# Patient Record
Sex: Female | Born: 1990 | Race: White | Hispanic: No | Marital: Married | Smoking: Former smoker
Health system: Southern US, Community
[De-identification: ages and names within clinical notes are randomized; demographics above are authoritative.]

## PROBLEM LIST (undated history)

## (undated) ENCOUNTER — Inpatient Hospital Stay (HOSPITAL_COMMUNITY): Payer: Self-pay

## (undated) ENCOUNTER — Inpatient Hospital Stay: Payer: Self-pay

## (undated) DIAGNOSIS — R519 Headache, unspecified: Secondary | ICD-10-CM

## (undated) DIAGNOSIS — R51 Headache: Secondary | ICD-10-CM

## (undated) DIAGNOSIS — O34219 Maternal care for unspecified type scar from previous cesarean delivery: Secondary | ICD-10-CM

## (undated) HISTORY — PX: LASIK: SHX215

---

## 2014-05-12 LAB — OB RESULTS CONSOLE ABO/RH: RH Type: POSITIVE

## 2014-05-12 LAB — OB RESULTS CONSOLE ANTIBODY SCREEN: Antibody Screen: NEGATIVE

## 2014-05-12 LAB — OB RESULTS CONSOLE RPR: RPR: NONREACTIVE

## 2014-05-12 LAB — OB RESULTS CONSOLE HIV ANTIBODY (ROUTINE TESTING): HIV: NONREACTIVE

## 2014-05-14 LAB — OB RESULTS CONSOLE GC/CHLAMYDIA
Chlamydia: NEGATIVE
GC PROBE AMP, GENITAL: NEGATIVE

## 2014-05-25 NOTE — L&D Delivery Note (Signed)
Cesarean Section Procedure Note  Indications: fetal intolerance of labor  Pre-operative Diagnosis: post term pregnancy with fetal intolerance of labor  Post-operative Diagnosis: same, live female infant  Attending: Mycah Formica, Honor Loh  Anesthesia: Epirdural, bolused  Surgeon: Kaybree Williams, Honor Loh  Assistant: Ova Freshwater  Estimated Blood Loss: 500cc   Drains: foley, to gravity, on-q pump with 2 cathethers   Total IV Fluids: 1042ml crystalloid  Findings: Normal appearing uterus, tubes and ovaries, no scar tissue.   Viable female infant weighing 6-10 (3000mg ) with apgars of 8 and 9.   Specimens: none   Complications: None apparent    Disposition: PACU - hemodynamically stable.     Indication for Surgery:  Cathy Benjamin was scheduled for an induction of labor due to past due dates, at 37 weeks. She arrived to the labor floor and was given cervadil due to a low Bishop score prompting cervical ripening. Through the night, she began contracting regularly and was found to be 3cm and 100% effaced, and the cervadil was removed. Following this, the fetal strip began to become concerning, with deep variables indicating cord compression. Intermittently there were late decelerations. There were no accelerations, and in between variables there were times of minimal variability. An IUPC was placed and an amnioinfusion was attempted, with the patient on hands and knees. This continued for one hour, and the decelerations decreased in frequency some, but remained persistent. The patient dilated to 6cm, however without resolve of the decelerations, the decision to proceed with a cesarean for fetal intolerance of labor was made. Terbutaline was administered and with uterine relaxation, the decelrations resolved.   The risks, benefits, complications, treatment options, and expected outcomes were discussed with the patient. Informed  consent was obtained. The patient was taken to Operating Room, identified as Cathy Benjamin and the procedure verified as a cesarean delivery.   Procedure Details   After administration of adequate anesthesia, the patient was draped and prepped in the usual sterile manner. A surgical time out was performed, with the pediatric team present. A Pfannenstiel incision was made and carried down through the subcutaneous tissue to the fascia. Fascial incision was made and extended transversely. The fascia was separated from the underlying rectus tissue superiorly and inferiorly. The peritoneum was identified and entered. Peritoneal incision was extended longitudinally. A low transverse uterine incision was made. Delivered from cephalic presentation was a 3000 gram Female with Apgar scores of 8 at one minute and 9 at five minutes. The umbilical cord was doubly clamped and cut, and the baby was handed off to the awaitng pediatrician. Cord blood was obtained for evaluation. The placenta was removed intact and appeared normal. The uterus, tubes and ovaries appeared normal. The uterine incision was closed with running locked sutures of Vicryl, and then a second, imbricating stitch was placed. Hemostasis was observed. The abdominal cavity was evacuated of extraneous fluid. The uterus was returned to the abdominal cavity and again the incision was inspected for hemostasis, which was confirmed. The paracolic gutters were cleaned. The peritoneum was reapproximated with vicryl, and the On-Q catheters were pierced through the skin and placed along the rectus muscles, sub-facially. The fascia was then reapproximated with running sutures of Vicryl. The subcutaneous tissue was irrigated and reapproximated with vicryl, and the skin was reapproximated with Monocryl.  Instrument, sponge, and needle counts were correct prior the abdominal closure and at the conclusion of the case.   I was present and performed this procedure in  its entirety. Larey Days, MD Attending  Chartered loss adjuster OB/GYN [facility]

## 2014-09-18 ENCOUNTER — Emergency Department
Admit: 2014-09-18 | Disposition: A | Discharge status: Home / Self Care | Payer: Self-pay | Admitting: Emergency Medicine

## 2014-09-21 LAB — BETA STREP CULTURE(ARMC)

## 2014-10-19 LAB — OB RESULTS CONSOLE GBS: GBS: POSITIVE

## 2014-11-04 ENCOUNTER — Observation Stay
Admission: EM | Admit: 2014-11-04 | Discharge: 2014-11-04 | Disposition: A | Discharge status: Home / Self Care | Payer: Medicaid Other | Attending: Obstetrics and Gynecology | Admitting: Obstetrics and Gynecology

## 2014-11-04 ENCOUNTER — Encounter: Payer: Self-pay | Admitting: *Deleted

## 2014-11-04 DIAGNOSIS — Z3A39 39 weeks gestation of pregnancy: Secondary | ICD-10-CM | POA: Insufficient documentation

## 2014-11-04 DIAGNOSIS — O368190 Decreased fetal movements, unspecified trimester, not applicable or unspecified: Secondary | ICD-10-CM | POA: Diagnosis present

## 2014-11-04 DIAGNOSIS — O368130 Decreased fetal movements, third trimester, not applicable or unspecified: Principal | ICD-10-CM | POA: Insufficient documentation

## 2014-11-04 HISTORY — DX: Headache: R51

## 2014-11-04 HISTORY — DX: Headache, unspecified: R51.9

## 2014-11-04 NOTE — H&P (Signed)
Obstetric H&P   Chief Complaint: Decreased fetal movement  Prenatal Care Provider: [facility]  History of Present Illness: 24 y.o. G1P0 68w2dby 11/09/2014, by Last Menstrual Period presenting to L&D for decreased fetal movement.  No LOF, no VB, no ctx.  [facility] uncomplicated  ABO, Rh: A/Positive/-- (12/19 0000)  Antibody: Negative (12/19 0000)  Rubella: MMR x 2 Varicella: Immune RPR: Nonreactive (12/19 0000)  HBsAg:    HIV: Non-reactive (12/19 0000)  RPR: Nonreactive (12/19 0000) 1-hr:  GBS: Positive (05/27 0000)   Review of Systems: 10 point review of systems negative unless otherwise noted in HPI  Past Medical History: Past Medical History  Diagnosis Date  . Headache     Past Surgical History: Past Surgical History  Procedure Laterality Date  . No past surgeries      Past Obstetric History:  Past Gynecologic History:  Family History: History reviewed. No pertinent family history.  Social History: History   Social History  . Marital Status: Unknown    Spouse Name: N/A  . Number of Children: N/A  . Years of Education: N/A   Occupational History  . Not on file.   Social History Main Topics  . Smoking status: Former SResearch scientist (life sciences) . Smokeless tobacco: Never Used  . Alcohol Use: No  . Drug Use: No  . Sexual Activity: Yes   Other Topics Concern  . Not on file   Social History Narrative  . No narrative on file    Medications: Prior to Admission medications   Medication Sig Start Date End Date Taking? Authorizing Provider  diphenhydrAMINE (SOMINEX) 25 MG tablet Take 25 mg by mouth at bedtime as needed for sleep.   Yes Historical Provider, MD  Prenatal Vit-Fe Fumarate-FA (PRENATAL MULTIVITAMIN) TABS tablet Take 1 tablet by mouth daily at 12 noon.   Yes Historical Provider, MD    Allergies: No Known Allergies  Physical Exam: Vitals: Blood pressure 133/81, pulse 87, last menstrual period 02/02/2014.  Urine Dip Protein: N/A  FHT: 120, moderate  variability, +accels, no decels Toco: irregular  General: NAD HEENT: normocephalic anicteric Pulmonary: no increased work of breathing Abdomen: Gravid Genitourinary: deferred Extremities: no edema  Labs: No results found for this or any previous visit (from the past 24 hour(s)).  Assessment: 24y.o. G1P0 344w2dy 11/09/2014 with decreased fetal movement  Plan: 1) Fetus - reactive NST, fetal kick counts discussed,  Movement increased after arrival.  Routine labor precautions  2) Disposition - follow up [facility] on 6/15

## 2014-11-16 ENCOUNTER — Inpatient Hospital Stay
Admission: EM | Admit: 2014-11-16 | Discharge: 2014-11-20 | DRG: 766 | Disposition: A | Discharge status: Home / Self Care | Payer: Medicaid Other | Attending: Obstetrics & Gynecology | Admitting: Obstetrics & Gynecology

## 2014-11-16 DIAGNOSIS — Z22330 Carrier of Group B streptococcus: Secondary | ICD-10-CM

## 2014-11-16 DIAGNOSIS — O480 Post-term pregnancy: Secondary | ICD-10-CM | POA: Diagnosis present

## 2014-11-16 DIAGNOSIS — Z79899 Other long term (current) drug therapy: Secondary | ICD-10-CM | POA: Diagnosis not present

## 2014-11-16 DIAGNOSIS — Z3A41 41 weeks gestation of pregnancy: Secondary | ICD-10-CM | POA: Diagnosis present

## 2014-11-16 DIAGNOSIS — Z87891 Personal history of nicotine dependence: Secondary | ICD-10-CM | POA: Diagnosis not present

## 2014-11-16 DIAGNOSIS — Z98891 History of uterine scar from previous surgery: Secondary | ICD-10-CM

## 2014-11-16 DIAGNOSIS — Z3490 Encounter for supervision of normal pregnancy, unspecified, unspecified trimester: Secondary | ICD-10-CM

## 2014-11-16 LAB — CBC
HCT: 39.1 % (ref 35.0–47.0)
Hemoglobin: 13.1 g/dL (ref 12.0–16.0)
MCH: 30 pg (ref 26.0–34.0)
MCHC: 33.4 g/dL (ref 32.0–36.0)
MCV: 89.8 fL (ref 80.0–100.0)
Platelets: 190 10*3/uL (ref 150–440)
RBC: 4.36 MIL/uL (ref 3.80–5.20)
RDW: 14 % (ref 11.5–14.5)
WBC: 12.5 10*3/uL — ABNORMAL HIGH (ref 3.6–11.0)

## 2014-11-16 MED ORDER — LACTATED RINGERS IV SOLN: 500.0000 mL | INTRAVENOUS | Status: DC | PRN

## 2014-11-16 MED ORDER — LACTATED RINGERS IV SOLN
INTRAVENOUS | Status: DC
  Administered 2014-11-16 – 2014-11-17 (×2): via INTRAVENOUS

## 2014-11-16 MED ORDER — ZOLPIDEM TARTRATE 5 MG PO TABS
ORAL_TABLET | ORAL | Status: AC
  Administered 2014-11-17: 5 mg via ORAL
  Filled 2014-11-16: qty 1

## 2014-11-16 MED ORDER — ACETAMINOPHEN 325 MG PO TABS: 650.0000 mg | ORAL_TABLET | ORAL | Status: DC | PRN

## 2014-11-16 MED ORDER — SODIUM CHLORIDE 0.9 % IV SOLN
2.0000 g | INTRAVENOUS | Status: AC
  Administered 2014-11-17: 2 g via INTRAVENOUS

## 2014-11-16 MED ORDER — ZOLPIDEM TARTRATE 5 MG PO TABS
5.0000 mg | ORAL_TABLET | Freq: Every evening | ORAL | Status: DC | PRN
  Administered 2014-11-17: 5 mg via ORAL

## 2014-11-16 MED ORDER — DINOPROSTONE 10 MG VA INST
10.0000 mg | VAGINAL_INSERT | Freq: Once | VAGINAL | Status: AC
  Administered 2014-11-16: 10 mg via VAGINAL
  Filled 2014-11-16: qty 1

## 2014-11-16 MED ORDER — BUTORPHANOL TARTRATE 1 MG/ML IJ SOLN: 2.0000 mg | INTRAMUSCULAR | Status: DC | PRN

## 2014-11-16 MED ORDER — ONDANSETRON HCL 4 MG/2ML IJ SOLN
4.0000 mg | Freq: Four times a day (QID) | INTRAMUSCULAR | Status: DC | PRN
  Administered 2014-11-17: 4 mg via INTRAVENOUS

## 2014-11-16 MED ORDER — BUTORPHANOL TARTRATE 1 MG/ML IJ SOLN: 1.0000 mg | INTRAMUSCULAR | Status: DC | PRN

## 2014-11-16 MED ORDER — SODIUM CHLORIDE 0.9 % IV SOLN
1.0000 g | INTRAVENOUS | Status: DC
  Administered 2014-11-17: 1 g via INTRAVENOUS

## 2014-11-16 MED ORDER — LIDOCAINE HCL (PF) 1 % IJ SOLN: 30.0000 mL | INTRAMUSCULAR | Status: DC | PRN

## 2014-11-16 NOTE — H&P (Signed)
OB History & Physical   History of Present Illness:  Chief Complaint:   HPI:  Cathy Benjamin is a 24 y.o. G1P0 female at [redacted]w[redacted]d dated by LMP with EDC of 11/09/14.  Her pregnancy has been uncomplicated.  She presents to L&D for IOL for past due date.    +FM, occasional CTX, no LOF, no VB  Maternal Medical History:   Past Medical History  Diagnosis Date  . Headache     Past Surgical History  Procedure Laterality Date  . No past surgeries      No Known Allergies  Prior to Admission medications   Medication Sig Start Date End Date Taking? Authorizing Provider  diphenhydrAMINE (SOMINEX) 25 MG tablet Take 25 mg by mouth at bedtime as needed for sleep.    Historical Provider, MD  Prenatal Vit-Fe Fumarate-FA (PRENATAL MULTIVITAMIN) TABS tablet Take 1 tablet by mouth daily at 12 noon.    Historical Provider, MD     Prenatal care site: [facility]   Social History: She  reports that she has quit smoking. She has never used smokeless tobacco. She reports that she does not drink alcohol or use illicit drugs.  Family History: family history is not on file.   Review of Systems: Negative x 10 systems reviewed except as noted in the HPI.     Physical Exam:  Vital Signs: Temp(Src) 98.3 F (36.8 C) (Oral)  Resp 20  Ht 5\' 1"  (1.549 m)  Wt 88.451 kg (195 lb)  BMI 36.86 kg/m2  LMP 02/02/2014 General: no acute distress.  HEENT: normocephalic, atraumatic Heart: regular rate & rhythm.  No murmurs/rubs/gallops Lungs: clear to auscultation bilaterally, normal respiratory effort Abdomen: soft, gravid, non-tender; Pelvic:   External: Normal external female genitalia  Cervix: closed/long/high Extremities: non-tender, symmetric, 1+dema bilaterally.  DTRs: 2+ Neurologic: Alert & oriented x 3.    Pertinent Results:  Prenatal Labs: Blood type/Rh A+  Antibody screen neg  Rubella immune  RPR NR  HBsAg neg  HIV neg  GC neg  Chlamydia neg  Genetic screening declined  1 hour GTT 102   3 hour GTT n/a  GBS POS   FHT: 115 mod +accels no decels TOCO: irregular SVE: closed/long/high SSE: deferred    Assessment:  Cathy Benjamin is a 24 y.o. G1P0 female at [redacted]w[redacted]d with IOL for past due date  Plan:  1. Admit to Labor & Delivery - notify anesthesia.   2. CBC, T&S, Clrs, saline lock - start IVF after cervical ripening  3. GBS positive - will defer ABX until after cervical ripening 4. Consents obtained. 5. Cervidil for cervical ripening 6. Continuous EFM/TOCO  Ward, Chelsea C  11/16/2014 10:43 PM

## 2014-11-17 ENCOUNTER — Inpatient Hospital Stay: Payer: Medicaid Other | Admitting: Anesthesiology

## 2014-11-17 ENCOUNTER — Encounter
Admission: EM | Disposition: A | Discharge status: Home / Self Care | Payer: Self-pay | Source: Home / Self Care | Attending: Obstetrics & Gynecology

## 2014-11-17 ENCOUNTER — Encounter: Payer: Self-pay | Admitting: Anesthesiology

## 2014-11-17 LAB — CHLAMYDIA/NGC RT PCR (ARMC ONLY)
CHLAMYDIA TR: NOT DETECTED
N gonorrhoeae: NOT DETECTED

## 2014-11-17 LAB — TYPE AND SCREEN
ABO/RH(D): A POS
Antibody Screen: NEGATIVE

## 2014-11-17 LAB — ABO/RH: ABO/RH(D): A POS

## 2014-11-17 SURGERY — Surgical Case
Anesthesia: Epidural

## 2014-11-17 MED ORDER — OXYTOCIN 40 UNITS IN LACTATED RINGERS INFUSION - SIMPLE MED
62.5000 mL/h | INTRAVENOUS | Status: DC
  Administered 2014-11-18: 62.5 mL/h via INTRAVENOUS
  Filled 2014-11-17: qty 1000

## 2014-11-17 MED ORDER — LACTATED RINGERS IV SOLN: INTRAVENOUS | Status: DC

## 2014-11-17 MED ORDER — IBUPROFEN 600 MG PO TABS
600.0000 mg | ORAL_TABLET | Freq: Four times a day (QID) | ORAL | Status: DC
  Administered 2014-11-17 – 2014-11-20 (×11): 600 mg via ORAL
  Filled 2014-11-17 (×11): qty 1

## 2014-11-17 MED ORDER — AMPICILLIN SODIUM 2 G IJ SOLR
INTRAMUSCULAR | Status: AC
  Administered 2014-11-17: 2 g via INTRAVENOUS
  Filled 2014-11-17: qty 2000

## 2014-11-17 MED ORDER — BUPIVACAINE HCL 0.5 % IJ SOLN
INTRAMUSCULAR | Status: DC | PRN
  Administered 2014-11-17: 09:00:00
  Administered 2014-11-17: 30 mL

## 2014-11-17 MED ORDER — BUPIVACAINE ON-Q PAIN PUMP (FOR ORDER SET NO CHG)
INJECTION | Status: DC
  Filled 2014-11-17: qty 1

## 2014-11-17 MED ORDER — SODIUM CHLORIDE 0.9 % IV SOLN
INTRAVENOUS | Status: AC
  Administered 2014-11-17: 1 g via INTRAVENOUS
  Filled 2014-11-17: qty 1000

## 2014-11-17 MED ORDER — CEFAZOLIN SODIUM-DEXTROSE 2-3 GM-% IV SOLR
2.0000 g | INTRAVENOUS | Status: AC
  Administered 2014-11-17: 2 g via INTRAVENOUS

## 2014-11-17 MED ORDER — BUPIVACAINE 0.25 % ON-Q PUMP SINGLE CATH 400 ML: 400.0000 mL | INJECTION | Status: DC

## 2014-11-17 MED ORDER — ONDANSETRON HCL 4 MG/2ML IJ SOLN: 4.0000 mg | Freq: Once | INTRAMUSCULAR | Status: DC | PRN

## 2014-11-17 MED ORDER — HYDROMORPHONE HCL 1 MG/ML IJ SOLN: 0.2500 mg | INTRAMUSCULAR | Status: DC | PRN

## 2014-11-17 MED ORDER — BUPIVACAINE HCL (PF) 0.5 % IJ SOLN
INTRAMUSCULAR | Status: AC
  Filled 2014-11-17: qty 30

## 2014-11-17 MED ORDER — NALBUPHINE HCL 10 MG/ML IJ SOLN
5.0000 mg | INTRAMUSCULAR | Status: DC | PRN
  Filled 2014-11-17: qty 0.5

## 2014-11-17 MED ORDER — MEPERIDINE HCL 25 MG/ML IJ SOLN: 6.2500 mg | INTRAMUSCULAR | Status: DC | PRN

## 2014-11-17 MED ORDER — DIPHENHYDRAMINE HCL 50 MG/ML IJ SOLN: 12.5000 mg | INTRAMUSCULAR | Status: DC | PRN

## 2014-11-17 MED ORDER — LIDOCAINE-EPINEPHRINE (PF) 1.5 %-1:200000 IJ SOLN
INTRAMUSCULAR | Status: DC | PRN
  Administered 2014-11-17: 3 mL

## 2014-11-17 MED ORDER — OXYTOCIN 40 UNITS IN LACTATED RINGERS INFUSION - SIMPLE MED
62.5000 mL/h | INTRAVENOUS | Status: DC
  Administered 2014-11-17: 62.5 mL/h via INTRAVENOUS

## 2014-11-17 MED ORDER — LIDOCAINE HCL (PF) 2 % IJ SOLN
INTRAMUSCULAR | Status: DC | PRN
  Administered 2014-11-17: 10 mL via EPIDURAL
  Administered 2014-11-17: 6 mL via EPIDURAL
  Administered 2014-11-17: 1 mL via EPIDURAL

## 2014-11-17 MED ORDER — FERROUS SULFATE 325 (65 FE) MG PO TABS
325.0000 mg | ORAL_TABLET | Freq: Two times a day (BID) | ORAL | Status: DC
  Administered 2014-11-17 – 2014-11-19 (×4): 325 mg via ORAL
  Filled 2014-11-17 (×4): qty 1

## 2014-11-17 MED ORDER — OXYTOCIN BOLUS FROM INFUSION: 500.0000 mL | INTRAVENOUS | Status: DC

## 2014-11-17 MED ORDER — SENNOSIDES-DOCUSATE SODIUM 8.6-50 MG PO TABS
2.0000 | ORAL_TABLET | ORAL | Status: DC
  Administered 2014-11-18 – 2014-11-19 (×2): 2 via ORAL
  Filled 2014-11-17 (×2): qty 2

## 2014-11-17 MED ORDER — BUPIVACAINE 0.25 % ON-Q PUMP DUAL CATH 400 ML
INJECTION | Status: AC
  Filled 2014-11-17: qty 400

## 2014-11-17 MED ORDER — NALBUPHINE HCL 10 MG/ML IJ SOLN
5.0000 mg | Freq: Once | INTRAMUSCULAR | Status: AC | PRN
  Filled 2014-11-17: qty 0.5

## 2014-11-17 MED ORDER — TERBUTALINE SULFATE 1 MG/ML IJ SOLN
0.2500 mg | Freq: Once | INTRAMUSCULAR | Status: AC
  Administered 2014-11-17: 0.25 mg via SUBCUTANEOUS

## 2014-11-17 MED ORDER — EPHEDRINE 5 MG/ML INJ: 10.0000 mg | INTRAVENOUS | Status: DC | PRN

## 2014-11-17 MED ORDER — PHENYLEPHRINE HCL 10 MG/ML IJ SOLN
INTRAMUSCULAR | Status: DC | PRN
  Administered 2014-11-17: 200 ug via INTRAVENOUS
  Administered 2014-11-17: 100 ug via INTRAVENOUS

## 2014-11-17 MED ORDER — TERBUTALINE SULFATE 1 MG/ML IJ SOLN
INTRAMUSCULAR | Status: AC
  Administered 2014-11-17: 0.25 mg via SUBCUTANEOUS
  Filled 2014-11-17: qty 1

## 2014-11-17 MED ORDER — ACETAMINOPHEN 325 MG PO TABS
650.0000 mg | ORAL_TABLET | Freq: Four times a day (QID) | ORAL | Status: DC
  Administered 2014-11-17 – 2014-11-20 (×11): 650 mg via ORAL
  Filled 2014-11-17 (×11): qty 2

## 2014-11-17 MED ORDER — DIPHENHYDRAMINE HCL 25 MG PO CAPS: 25.0000 mg | ORAL_CAPSULE | ORAL | Status: DC | PRN

## 2014-11-17 MED ORDER — OXYTOCIN 10 UNIT/ML IJ SOLN
40.0000 [IU] | INTRAMUSCULAR | Status: DC | PRN
  Administered 2014-11-17: 40 [IU] via INTRAVENOUS

## 2014-11-17 MED ORDER — NALOXONE HCL 0.4 MG/ML IJ SOLN: 0.4000 mg | INTRAMUSCULAR | Status: DC | PRN

## 2014-11-17 MED ORDER — BUPIVACAINE HCL (PF) 0.25 % IJ SOLN
INTRAMUSCULAR | Status: DC | PRN
  Administered 2014-11-17: 10 mL

## 2014-11-17 MED ORDER — PRENATAL MULTIVITAMIN CH
1.0000 | ORAL_TABLET | Freq: Every day | ORAL | Status: DC
  Administered 2014-11-17 – 2014-11-19 (×3): 1 via ORAL
  Filled 2014-11-17 (×3): qty 1

## 2014-11-17 MED ORDER — OXYTOCIN 40 UNITS IN LACTATED RINGERS INFUSION - SIMPLE MED
INTRAVENOUS | Status: AC
  Administered 2014-11-17: 62.5 mL/h via INTRAVENOUS
  Filled 2014-11-17: qty 1000

## 2014-11-17 MED ORDER — ZOLPIDEM TARTRATE 5 MG PO TABS
ORAL_TABLET | ORAL | Status: AC
  Filled 2014-11-17: qty 1

## 2014-11-17 MED ORDER — ONDANSETRON HCL 4 MG/2ML IJ SOLN: 4.0000 mg | Freq: Three times a day (TID) | INTRAMUSCULAR | Status: DC | PRN

## 2014-11-17 MED ORDER — FENTANYL CITRATE (PF) 100 MCG/2ML IJ SOLN: 25.0000 ug | INTRAMUSCULAR | Status: DC | PRN

## 2014-11-17 MED ORDER — FENTANYL 2.5 MCG/ML W/ROPIVACAINE 0.2% IN NS 100 ML EPIDURAL INFUSION (ARMC-ANES)
9.0000 mL/h | EPIDURAL | Status: DC
  Administered 2014-11-17 (×2): 9 mL/h via EPIDURAL

## 2014-11-17 MED ORDER — DIPHENHYDRAMINE HCL 25 MG PO CAPS: 25.0000 mg | ORAL_CAPSULE | Freq: Four times a day (QID) | ORAL | Status: DC | PRN

## 2014-11-17 MED ORDER — SIMETHICONE 80 MG PO CHEW
80.0000 mg | CHEWABLE_TABLET | Freq: Three times a day (TID) | ORAL | Status: DC
  Administered 2014-11-17 – 2014-11-20 (×8): 80 mg via ORAL
  Filled 2014-11-17 (×9): qty 1

## 2014-11-17 MED ORDER — MORPHINE SULFATE (PF) 0.5 MG/ML IJ SOLN
INTRAMUSCULAR | Status: DC | PRN
  Administered 2014-11-17: 3 mg via EPIDURAL
  Administered 2014-11-17: 2 mg via INTRAVENOUS

## 2014-11-17 MED ORDER — MENTHOL 3 MG MT LOZG: 1.0000 | LOZENGE | OROMUCOSAL | Status: DC | PRN

## 2014-11-17 MED ORDER — CEFAZOLIN SODIUM-DEXTROSE 2-3 GM-% IV SOLR
INTRAVENOUS | Status: AC
  Filled 2014-11-17: qty 50

## 2014-11-17 MED ORDER — LANOLIN HYDROUS EX OINT: 1.0000 "application " | TOPICAL_OINTMENT | CUTANEOUS | Status: DC | PRN

## 2014-11-17 MED ORDER — BUPIVACAINE 0.25 % ON-Q PUMP DUAL CATH 400 ML: 400.0000 mL | INJECTION | Status: DC

## 2014-11-17 MED ORDER — OXYCODONE HCL 5 MG PO TABS: 10.0000 mg | ORAL_TABLET | ORAL | Status: DC | PRN

## 2014-11-17 MED ORDER — NALOXONE HCL 1 MG/ML IJ SOLN
1.0000 ug/kg/h | INTRAVENOUS | Status: DC | PRN
  Filled 2014-11-17: qty 2

## 2014-11-17 MED ORDER — SODIUM CHLORIDE 0.9 % IJ SOLN: 3.0000 mL | INTRAMUSCULAR | Status: DC | PRN

## 2014-11-17 MED ORDER — OXYTOCIN 10 UNIT/ML IJ SOLN: 40.0000 [IU] | INTRAVENOUS | Status: DC | PRN

## 2014-11-17 MED ORDER — WITCH HAZEL-GLYCERIN EX PADS: 1.0000 "application " | MEDICATED_PAD | CUTANEOUS | Status: DC | PRN

## 2014-11-17 MED ORDER — LIDOCAINE-EPINEPHRINE (PF) 1.5 %-1:200000 IJ SOLN: INTRAMUSCULAR | Status: DC | PRN

## 2014-11-17 MED ORDER — FENTANYL 2.5 MCG/ML W/ROPIVACAINE 0.2% IN NS 100 ML EPIDURAL INFUSION (ARMC-ANES)
EPIDURAL | Status: AC
  Administered 2014-11-17: 9 mL/h via EPIDURAL
  Filled 2014-11-17: qty 100

## 2014-11-17 MED ORDER — KETOROLAC TROMETHAMINE 30 MG/ML IJ SOLN
30.0000 mg | Freq: Four times a day (QID) | INTRAMUSCULAR | Status: DC | PRN
  Administered 2014-11-17: 30 mg via INTRAVENOUS

## 2014-11-17 MED ORDER — OXYCODONE HCL 5 MG PO TABS: 5.0000 mg | ORAL_TABLET | ORAL | Status: DC | PRN

## 2014-11-17 MED ORDER — PHENYLEPHRINE 40 MCG/ML (10ML) SYRINGE FOR IV PUSH (FOR BLOOD PRESSURE SUPPORT): 80.0000 ug | PREFILLED_SYRINGE | INTRAVENOUS | Status: DC | PRN

## 2014-11-17 MED ORDER — OXYCODONE-ACETAMINOPHEN 5-325 MG PO TABS: 1.0000 | ORAL_TABLET | ORAL | Status: DC | PRN

## 2014-11-17 MED ORDER — DIBUCAINE 1 % RE OINT: 1.0000 "application " | TOPICAL_OINTMENT | RECTAL | Status: DC | PRN

## 2014-11-17 MED ORDER — CITRIC ACID-SODIUM CITRATE 334-500 MG/5ML PO SOLN
ORAL | Status: AC
  Administered 2014-11-17: 08:00:00 via ORAL
  Filled 2014-11-17: qty 15

## 2014-11-17 MED ORDER — ZOLPIDEM TARTRATE 5 MG PO TABS
5.0000 mg | ORAL_TABLET | Freq: Once | ORAL | Status: AC
  Administered 2014-11-17: 5 mg via ORAL

## 2014-11-17 MED ORDER — KETOROLAC TROMETHAMINE 30 MG/ML IJ SOLN: 30.0000 mg | Freq: Four times a day (QID) | INTRAMUSCULAR | Status: DC | PRN

## 2014-11-17 SURGICAL SUPPLY — 31 items
CANISTER SUCT 3000ML (MISCELLANEOUS) ×3 IMPLANT
CATH KIT ON-Q SILVERSOAK 5IN (CATHETERS) ×6 IMPLANT
CLOSURE WOUND 1/2 X4 (GAUZE/BANDAGES/DRESSINGS) ×1
DRSG TEGADERM 4X4.75 (GAUZE/BANDAGES/DRESSINGS) ×3 IMPLANT
DRSG TELFA 3X8 NADH (GAUZE/BANDAGES/DRESSINGS) ×3 IMPLANT
ELECT CAUTERY BLADE 6.4 (BLADE) ×3 IMPLANT
GAUZE SPONGE 4X4 12PLY STRL (GAUZE/BANDAGES/DRESSINGS) ×3 IMPLANT
GLOVE BIOGEL PI IND STRL 6.5 (GLOVE) ×8 IMPLANT
GLOVE BIOGEL PI INDICATOR 6.5 (GLOVE) ×16
GLOVE SURG SYN 6.5 ES PF (GLOVE) ×3 IMPLANT
GOWN STRL REUS W/ TWL LRG LVL3 (GOWN DISPOSABLE) ×4 IMPLANT
GOWN STRL REUS W/TWL LRG LVL3 (GOWN DISPOSABLE) ×8
LIQUID BAND (GAUZE/BANDAGES/DRESSINGS) ×3 IMPLANT
NS IRRIG 1000ML POUR BTL (IV SOLUTION) ×3 IMPLANT
PACK C SECTION AR (MISCELLANEOUS) ×3 IMPLANT
PAD GROUND ADULT SPLIT (MISCELLANEOUS) ×3 IMPLANT
PAD OB MATERNITY 4.3X12.25 (PERSONAL CARE ITEMS) ×3 IMPLANT
PAD PREP 24X41 OB/GYN DISP (PERSONAL CARE ITEMS) ×3 IMPLANT
SPONGE LAP 18X18 5 PK (GAUZE/BANDAGES/DRESSINGS) ×3 IMPLANT
STRAP SAFETY BODY (MISCELLANEOUS) ×3 IMPLANT
STRIP CLOSURE SKIN 1/2X4 (GAUZE/BANDAGES/DRESSINGS) ×2 IMPLANT
SUT MNCRL 4-0 (SUTURE) ×2
SUT MNCRL 4-0 27XMFL (SUTURE) ×1
SUT PDS AB 1 TP1 96 (SUTURE) ×3 IMPLANT
SUT VIC AB 0 CT1 36 (SUTURE) ×6 IMPLANT
SUT VIC AB 2-0 CT1 27 (SUTURE) ×2
SUT VIC AB 2-0 CT1 TAPERPNT 27 (SUTURE) ×1 IMPLANT
SUT VIC AB 3-0 SH 27 (SUTURE) ×2
SUT VIC AB 3-0 SH 27X BRD (SUTURE) ×1 IMPLANT
SUTURE MNCRL 4-0 27XMF (SUTURE) ×1 IMPLANT
SWABSTK COMLB BENZOIN TINCTURE (MISCELLANEOUS) ×3 IMPLANT

## 2014-11-17 NOTE — Anesthesia Preprocedure Evaluation (Signed)
Anesthesia Evaluation  Patient identified by MRN, date of birth, ID band Patient awake    Reviewed: Allergy & Precautions, NPO status , Patient's Chart, lab work & pertinent test results  Airway Mallampati: II  TM Distance: >3 FB Neck ROM: Full    Dental no notable dental hx.    Pulmonary neg pulmonary ROS, former smoker,    Pulmonary exam normal       Cardiovascular negative cardio ROS Normal cardiovascular exam    Neuro/Psych  Headaches, negative psych ROS   GI/Hepatic negative GI ROS, Neg liver ROS,   Endo/Other  negative endocrine ROS  Renal/GU negative Renal ROS  negative genitourinary   Musculoskeletal negative musculoskeletal ROS (+)   Abdominal Normal abdominal exam  (+) + obese,   Peds negative pediatric ROS (+)  Hematology negative hematology ROS (+)   Anesthesia Other Findings   Reproductive/Obstetrics (+) Pregnancy                             Anesthesia Physical Anesthesia Plan  ASA: II  Anesthesia Plan: Epidural   Post-op Pain Management:    Induction:   Airway Management Planned: Natural Airway  Additional Equipment:   Intra-op Plan:   Post-operative Plan:   Informed Consent: I have reviewed the patients History and Physical, chart, labs and discussed the procedure including the risks, benefits and alternatives for the proposed anesthesia with the patient or authorized representative who has indicated his/her understanding and acceptance.   Dental advisory given  Plan Discussed with: Surgeon  Anesthesia Plan Comments: (Patient agrees to epidural.)        Anesthesia Quick Evaluation

## 2014-11-17 NOTE — OR Nursing (Signed)
Placenta sent to L&D frig

## 2014-11-17 NOTE — Progress Notes (Signed)
Intrapartum progress note:  Fetal strip reviewed.  After 1 hour of amnioinfusion, there is improvement but still a concerning strip. With late/variables  Cervical dilation 6.5cm, 100%, -1station.  I am concerned that there will not be any improvement beyond this, and the baby has a high likelyhood of developing acidemia if labor were to continue to 2nd stage and attempt SVD.  I had a thorough discussion with Mr and Mrs Roszak regarding my concerns, and they understand and agree to have a primary LTCS for fetal intolerance of labor.  Risks, benefits and alternatives were discussed in detail, and informed consent is obtained.  Plan: 0.25mg  Terbutaline sub-q now. Notify OR staff, anesthesia, NICU, nursing and prepare for cesarean delivery.  Larey Days, MD Attending Obstetrician and Gynecologist [facility]

## 2014-11-17 NOTE — Discharge Summary (Signed)
Obstetrical Discharge Summary  Date of Admission: 11/16/2014 Date of Discharge: 11/20/2014  Primary OB: [facility]  Gestational Age at Delivery: [redacted]w[redacted]d   Antepartum complications: past due date Reason for Admission: induction of labor Date of Delivery: 11/20/2014 Delivered By: Larey Days, MD Delivery Type: primary cesarean section, low transverse incision Intrapartum complications/course: Cathy Benjamin was scheduled for an induction of labor due to past due dates, at 30 weeks. She arrived to the labor floor and was given cervadil due to a low Bishop score prompting cervical ripening. Through the night, she began contracting regularly and was found to be 3cm and 100% effaced, and the cervadil was removed. Antibiotics were administered for GBS prophylaxis. Following this, the fetal strip began to become concerning, with deep variables indicating cord compression. Intermittently there were late decelerations. There were no accelerations, and in between variables there were times of minimal variability. An IUPC was placed and an amnioinfusion was attempted, with the patient on hands and knees. This continued for one hour, and the decelerations decreased in frequency some, but remained persistent. The patient dilated to 6cm, however without resolve of the decelerations, the decision to proceed with a cesarean for fetal intolerance of labor was made. Terbutaline was administered and with uterine relaxation, the decelrations resolved. Anesthesia: epidural Placenta: spontaneous Laceration: n/a  Episiotomy: n/a Newborn Data: Live born female  Birth Weight: 6 lb 9.8 oz (2999 g) APGAR: 8, 9   Physical Exam:  General: alert, cooperative, appears stated age and no distress Lochia: appropriate Uterine Fundus: firm Incision: healing well, OTA c/d/i DVT Evaluation: No evidence of DVT seen on physical exam.  HEMOGLOBIN  Date Value Ref Range Status  11/18/2014 10.8* 12.0 - 16.0 g/dL Final    HCT  Date Value Ref Range Status  11/18/2014 31.6* 35.0 - 47.0 % Final    Post partum course: Pt with uncomplicated post-op course. Pt tolerating po, voiding, and pain and bleeding are well controlled. Pt stable for d/c on POD 3.  Postpartum Procedures: none Disposition: Home  Rh Immune globulin given: no Rubella vaccine given: no Tdap vaccine given in AP or PP setting: yes, antepartum Flu vaccine given in AP or PP setting: no  Contraception: Plans Mirena   Prenatal Labs:  Blood type/Rh A+  Antibody screen neg  Rubella immune  RPR NR  HBsAg neg  HIV neg  GC neg  Chlamydia neg  Genetic screening declined  1 hour GTT 102  3 hour GTT n/a  GBS POS          Plan:  Cathy Benjamin was discharged to home in good condition. Follow-up appointment at [facility] with Dr Leonides Schanz in 3 days  No future appointments.  Discharge Medications:   Medication List    STOP taking these medications        diphenhydrAMINE 25 MG tablet  Commonly known as:  SOMINEX      TAKE these medications        acetaminophen 325 MG tablet  Commonly known as:  TYLENOL  Take 2 tablets (650 mg total) by mouth every 6 (six) hours.     ibuprofen 600 MG tablet  Commonly known as:  ADVIL,MOTRIN  Take 1 tablet (600 mg total) by mouth every 6 (six) hours as needed.     Oxycodone HCl 10 MG Tabs  Take 0.5-1 tablets (5-10 mg total) by mouth every 4 (four) hours as needed for moderate pain or severe pain.     prenatal multivitamin Tabs tablet  Take  1 tablet by mouth daily at 12 noon.        Signed  Louisa Second 11/20/2014 9:51 AM

## 2014-11-17 NOTE — Anesthesia Procedure Notes (Signed)
Epidural Patient location during procedure: OB Start time: 11/17/2014 2:50 AM End time: 11/17/2014 2:58 AM  Staffing Anesthesiologist: Alvin Critchley Performed by: anesthesiologist   Preanesthetic Checklist Completed: patient identified, site marked, pre-op evaluation, timeout performed, IV checked, risks and benefits discussed and monitors and equipment checked  Epidural Patient position: sitting Prep: Betadine Patient monitoring: heart rate, cardiac monitor, continuous pulse ox and blood pressure Approach: midline Location: L3-L4 Injection technique: LOR air  Needle:  Needle type: Tuohy  Needle gauge: 18 G Needle length: 9 cm Catheter type: closed end flexible Catheter size: 20 Guage Test dose: negative and 1.5% lidocaine with Epi 1:200 K  Assessment Sensory level: T8  Additional Notes Patient tolerated the procedure well.  Transient R sided paresthesia resolve quickly after thread of catheter and reposition.  Block placed under sterile prep and drapeReason for block:procedure for pain

## 2014-11-17 NOTE — Transfer of Care (Addendum)
Immediate Anesthesia Transfer of Care Note  Patient: Cathy Benjamin  Procedure(s) Performed: Procedure(s): CESAREAN SECTION (N/A)  Patient Location: PACU and Women's Unit  Anesthesia Type:Epidural  Level of Consciousness: awake, alert  and oriented  Airway & Oxygen Therapy: Patient Spontanous Breathing  Post-op Assessment: Report given to RN and Post -op Vital signs reviewed and stable  Post vital signs: Reviewed and stable  Last Vitals:  Filed Vitals:   11/17/14 0733  BP:   Pulse:   Temp: 36.7 C  Resp:     Complications: No apparent anesthesia complications

## 2014-11-17 NOTE — Progress Notes (Signed)
Intrapartum progress note:  Patient sleepy and snoring during conversation, although able to answer questions and respond with movements. Comfortable with epidural.  BPs 93-133/59-82, currently 125/70  FHT: 145 min to mod, no accels, +lates vs deep variables TOCO: q1-2 mins no pit Cervix: 5/100/-1  FSE and IUPC placed  A/P: 24yo G1P0 @ 41.1 with IOL for past due dates 1. IUP Concerning category 2 strip Amnioinfusion started with NS 500cc bolus, then 50cc/hr unless no evidence of return of fluid or resting tone of uterus >7mmHg Will consider terbutaline.  2. IOL S/p cervidil - removed early due to spontaneous contractions and variables

## 2014-11-17 NOTE — Op Note (Signed)
Cesarean Section Procedure Note  Indications: fetal intolerance of labor  Pre-operative Diagnosis: post term pregnancy with fetal intolerance of labor  Post-operative Diagnosis: same, live female infant  Attending: Jacobo Moncrief, Honor Loh  Anesthesia:  Epirdural, bolused  Surgeon: Tomoya Ringwald, Honor Loh  Assistant:  Ova Freshwater  Estimated Blood Loss:  500cc         Drains: foley, to gravity, on-q pump with 2 cathethers         Total IV Fluids:  1042ml crystalloid  Findings:  Normal appearing uterus, tubes and ovaries, no scar tissue.                    Viable female infant weighing 6-10 (3000mg ) with apgars of 8 and 9.         Specimens: none         Complications:  None apparent       Disposition: PACU - hemodynamically stable.           Indication for Surgery:  Cathy Benjamin was scheduled for an induction of labor due to past due dates, at 61 weeks.  She arrived to the labor floor and was given cervadil due to a low Bishop score prompting cervical ripening.  Through the night, she began contracting regularly and was found to be 3cm and 100% effaced, and the cervadil was removed.  Following this, the fetal strip began to become concerning, with deep variables indicating cord compression.  Intermittently there were late decelerations.  There were no accelerations, and in between variables there were times of minimal variability.  An IUPC was placed and an amnioinfusion was attempted, with the patient on hands and knees.  This continued for one hour, and the decelerations decreased in frequency some, but remained persistent.  The patient dilated to 6cm, however without resolve of the decelerations, the decision to proceed with a cesarean for fetal intolerance of labor was made.  Terbutaline was administered and with uterine relaxation, the decelrations resolved.    The risks, benefits, complications, treatment options, and expected outcomes were discussed with the patient. Informed consent was  obtained. The patient was taken to Operating Room, identified as Cathy Benjamin and the procedure verified as a cesarean delivery.   Procedure Details   After administration of adequate anesthesia, the patient was draped and prepped in the usual sterile manner. A surgical time out was performed, with the pediatric team present. A Pfannenstiel incision was made and carried down through the subcutaneous tissue to the fascia. Fascial incision was made and extended transversely. The fascia was separated from the underlying rectus tissue superiorly and inferiorly. The peritoneum was identified and entered. Peritoneal incision was extended longitudinally.  A low transverse uterine incision was made. Delivered from cephalic presentation was a 3000 gram Female with Apgar scores of 8 at one minute and 9 at five minutes. The umbilical cord was doubly clamped and cut, and the baby was handed off to the awaitng pediatrician.  Cord blood was obtained for evaluation. The placenta was removed intact and appeared normal. The uterus, tubes and ovaries appeared normal. The uterine incision was closed with running locked sutures of Vicryl, and then a second, imbricating stitch was placed. Hemostasis was observed. The abdominal cavity was evacuated of extraneous fluid. The uterus was returned to the abdominal cavity and again the incision was inspected for hemostasis, which was confirmed.  The paracolic gutters were cleaned.  The peritoneum was reapproximated with vicryl, and the On-Q catheters were pierced through the  skin and placed along the rectus muscles, sub-facially. The fascia was then reapproximated with running sutures of Vicryl. The subcutaneous tissue was irrigated and reapproximated with vicryl, and the skin was reapproximated with Monocryl.  Instrument, sponge, and needle counts were correct prior the abdominal closure and at the conclusion of the case.   I was present and performed this procedure in its  entirety. Larey Days, MD Attending Obstetrician and Gynecologist [facility]

## 2014-11-18 LAB — CBC
HEMATOCRIT: 31.6 % — AB (ref 35.0–47.0)
Hemoglobin: 10.8 g/dL — ABNORMAL LOW (ref 12.0–16.0)
MCH: 30.4 pg (ref 26.0–34.0)
MCHC: 34.1 g/dL (ref 32.0–36.0)
MCV: 89.2 fL (ref 80.0–100.0)
Platelets: 157 10*3/uL (ref 150–440)
RBC: 3.54 MIL/uL — ABNORMAL LOW (ref 3.80–5.20)
RDW: 14.3 % (ref 11.5–14.5)
WBC: 12.2 10*3/uL — AB (ref 3.6–11.0)

## 2014-11-18 LAB — RPR: RPR Ser Ql: NONREACTIVE

## 2014-11-18 NOTE — Progress Notes (Signed)
Subjective: Postpartum Day 1: Cesarean Delivery for fetal intolerance to labor  Patient reports tolerating PO.  She reports her pain and bleeding are controlled. She has eaten breakfast. She has not gotten up to void yet.   Objective: Vital signs in last 24 hours: Temp:  [97.6 F (36.4 C)-99.7 F (37.6 C)] 97.9 F (36.6 C) (06/26 0720) Pulse Rate:  [82-117] 88 (06/26 0720) Resp:  [18-20] 20 (06/26 0720) BP: (96-125)/(55-72) 102/61 mmHg (06/26 0720) SpO2:  [97 %-99 %] 97 % (06/26 0720)  Physical Exam:  General: alert, cooperative, appears stated age and no distress Lochia: appropriate Uterine Fundus: firm Incision: healing well, OTA c/d/i DVT Evaluation: No evidence of DVT seen on physical exam. Calf/Ankle edema is present. SCDs in place    Recent Labs  11/16/14 2230 11/18/14 0457  HGB 13.1 10.8*  HCT 39.1 31.6*    Assessment/Plan: Status post Cesarean section. Doing well postoperatively.  Continue current care. DC IV and IVF if tolerating PO and voiding Ambulate  PO pain medications  Anticipate discharge on POD 3  Aza Dantes,  Khai Arrona 11/18/2014, 11:03 AM

## 2014-11-18 NOTE — Anesthesia Postprocedure Evaluation (Signed)
  Anesthesia Post-op Note  Patient: Cathy Benjamin  Procedure(s) Performed: Procedure(s): CESAREAN SECTION (N/A)  Anesthesia type:Epidural  Patient location: room 345  Post pain: Pain level controlled  Post assessment: Post-op Vital signs reviewed, Patient's Cardiovascular Status Stable, Respiratory Function Stable, Patent Airway and No signs of Nausea or vomiting  Post vital signs: Reviewed and stable  Last Vitals:  Filed Vitals:   11/18/14 0720  BP: 102/61  Pulse: 88  Temp: 36.6 C  Resp: 20    Level of consciousness: awake, alert  and patient cooperative  Complications: No apparent anesthesia complications

## 2014-11-18 NOTE — Anesthesia Post-op Follow-up Note (Signed)
  Anesthesia Pain Follow-up Note  Patient: Cathy Benjamin  Day #: 1  Date of Follow-up: 11/18/2014 Time: 7:54 AM  Last Vitals:  Filed Vitals:   11/18/14 0720  BP: 102/61  Pulse: 88  Temp: 36.6 C  Resp: 20    Level of Consciousness: alert  Pain: mild   Side Effects:None  Catheter Site Exam:clean, dry, no drainage  Plan: D/C from anesthesia care  Burnadette Baskett,  Velora Heckler

## 2014-11-19 ENCOUNTER — Encounter: Payer: Self-pay | Admitting: Obstetrics & Gynecology

## 2014-11-19 MED ORDER — FERROUS SULFATE 325 (65 FE) MG PO TABS
325.0000 mg | ORAL_TABLET | Freq: Every day | ORAL | Status: DC
Start: 2014-11-20 — End: 2014-11-20
  Administered 2014-11-20: 325 mg via ORAL
  Filled 2014-11-19: qty 1

## 2014-11-19 NOTE — Progress Notes (Addendum)
POD#2 LTCS for FITL. Late note  From rounds around 1330 today  S: Feeling better today. Got to sleep for 4 uninterrupted hours. Baby having some temperature regulation problems. Breastfeeding  O:BP 123/75 mmHg  Pulse 83  Temp(Src) 99.4 F (37.4 C) (Oral)  Resp 18  Ht 5\' 1"  (1.549 m)  Wt 88.451 kg (195 lb)  BMI 36.86 kg/m2  SpO2 98%  LMP 02/02/2014  General: appears comfortable Heart: RRR without murmur Lungs: CTA ABD: BS active, soft, not tympanic. Passing flatus Incision : C+D+I. ON-Q intact Bleeding WNL No calf tenderness  A: Stable POD #2  P: Probable discharge in AM Continue current POM.  Dalia Heading, CNM

## 2014-11-20 ENCOUNTER — Encounter: Payer: Self-pay | Admitting: Emergency Medicine

## 2014-11-20 DIAGNOSIS — Z98891 History of uterine scar from previous surgery: Secondary | ICD-10-CM

## 2014-11-20 MED ORDER — ACETAMINOPHEN 325 MG PO TABS: 650.0000 mg | ORAL_TABLET | Freq: Four times a day (QID) | ORAL | Status: DC

## 2014-11-20 MED ORDER — OXYCODONE HCL 10 MG PO TABS: 5.0000 mg | ORAL_TABLET | ORAL | Status: DC | PRN

## 2014-11-20 MED ORDER — IBUPROFEN 600 MG PO TABS: 600.0000 mg | ORAL_TABLET | Freq: Four times a day (QID) | ORAL | Status: DC | PRN

## 2014-11-20 NOTE — Discharge Instructions (Signed)
You have had abdominal surgery, please remember this!  You will want to be active and take care of your new baby, but there will be times you will need to rest.  Please listen to your body and increase your activity level as you tolerate it.  Do not lift anything more than 10 pounds for your 6 weeks of recovery, as you may test the strength of your incision and create a hernia.  Being up and active is important to prevent blood clots.    Please follow up in 1 week for your incision check, and to address any concerns you may have.  Cesarean Delivery, Care After Refer to this sheet in the next few weeks. These instructions provide you with information on caring for yourself after your procedure. Your health care provider may also give you specific instructions. Your treatment has been planned according to current medical practices, but problems sometimes occur. Call your health care provider if you have any problems or questions after you go home. HOME CARE INSTRUCTIONS  Only take over-the-counter or prescription medications as directed by your health care provider.  Do not drink alcohol, especially if you are breastfeeding or taking medication to relieve pain.  Do not chew or smoke tobacco.  Continue to use good perineal care. Good perineal care includes:  Wiping your perineum from front to back.  Keeping your perineum clean.  Check your surgical cut (incision) daily for increased redness, drainage, swelling, or separation of skin.  Clean your incision gently with soap and water every day, and then pat it dry. If your health care provider says it is okay, leave the incision uncovered. Use a bandage (dressing) if the incision is draining fluid or appears irritated. If the adhesive strips across the incision do not fall off within 7 days, carefully peel them off.  Hug a pillow when coughing or sneezing until your incision is healed. This helps to relieve pain.  Do not use tampons or douche  until your health care provider says it is okay.  Shower, wash your hair, and take tub baths as directed by your health care provider.  Wear a well-fitting bra that provides breast support.  Limit wearing support panties or control-top hose.  Drink enough fluids to keep your urine clear or pale yellow.  Eat high-fiber foods such as whole grain cereals and breads, brown rice, beans, and fresh fruits and vegetables every day. These foods may help prevent or relieve constipation.  Resume activities such as climbing stairs, driving, lifting, exercising, or traveling as directed by your health care provider.  Talk to your health care provider about resuming sexual activities. This is dependent upon your risk of infection, your rate of healing, and your comfort and desire to resume sexual activity.  Try to have someone help you with your household activities and your newborn for at least a few days after you leave the hospital.  Rest as much as possible. Try to rest or take a nap when your newborn is sleeping.  Increase your activities gradually.  Keep all of your scheduled postpartum appointments. It is very important to keep your scheduled follow-up appointments. At these appointments, your health care provider will be checking to make sure that you are healing physically and emotionally. SEEK MEDICAL CARE IF:   You are passing large clots from your vagina. Save any clots to show your health care provider.  You have a foul smelling discharge from your vagina.  You have trouble urinating.  You  are urinating frequently.  You have pain when you urinate.  You have a change in your bowel movements.  You have increasing redness, pain, or swelling near your incision.  You have pus draining from your incision.  Your incision is separating.  You have painful, hard, or reddened breasts.  You have a severe headache.  You have blurred vision or see spots.  You feel sad or  depressed.  You have thoughts of hurting yourself or your newborn.  You have questions about your care, the care of your newborn, or medications.  You are dizzy or light-headed.  You have a rash.  You have pain, redness, or swelling at the site of the removed intravenous access (IV) tube.  You have nausea or vomiting.  You stopped breastfeeding and have not had a menstrual period within 12 weeks of stopping.  You are not breastfeeding and have not had a menstrual period within 12 weeks of delivery.  You have a fever. SEEK IMMEDIATE MEDICAL CARE IF:  You have persistent pain.  You have chest pain.  You have shortness of breath.  You faint.  You have leg pain.  You have stomach pain.  Your vaginal bleeding saturates 2 or more sanitary pads in 1 hour. MAKE SURE YOU:   Understand these instructions.  Will watch your condition.  Will get help right away if you are not doing well or get worse. Document Released: 01/31/2002 Document Revised: 09/25/2013 Document Reviewed: 01/06/2012 [facility] Patient Information 2015 [place], [place]. This information is not intended to replace advice given to you by your health care provider. Make sure you discuss any questions you have with your health care provider.

## 2014-12-07 ENCOUNTER — Encounter: Payer: Self-pay | Admitting: Obstetrics & Gynecology

## 2014-12-07 NOTE — OR Nursing (Signed)
Late entry for patient end time (by Sharmon Leyden).  Entered on wrong line by circulating nurse.

## 2016-03-28 ENCOUNTER — Encounter (HOSPITAL_COMMUNITY): Payer: Self-pay | Admitting: *Deleted

## 2016-03-28 ENCOUNTER — Emergency Department (HOSPITAL_COMMUNITY)
Admission: EM | Admit: 2016-03-28 | Discharge: 2016-03-28 | Disposition: A | Discharge status: Home / Self Care | Payer: Medicaid Other | Attending: Emergency Medicine | Admitting: Emergency Medicine

## 2016-03-28 DIAGNOSIS — X58XXXA Exposure to other specified factors, initial encounter: Secondary | ICD-10-CM | POA: Insufficient documentation

## 2016-03-28 DIAGNOSIS — Y999 Unspecified external cause status: Secondary | ICD-10-CM | POA: Diagnosis not present

## 2016-03-28 DIAGNOSIS — Z87891 Personal history of nicotine dependence: Secondary | ICD-10-CM | POA: Diagnosis not present

## 2016-03-28 DIAGNOSIS — Z3A25 25 weeks gestation of pregnancy: Secondary | ICD-10-CM | POA: Insufficient documentation

## 2016-03-28 DIAGNOSIS — O9A212 Injury, poisoning and certain other consequences of external causes complicating pregnancy, second trimester: Secondary | ICD-10-CM | POA: Insufficient documentation

## 2016-03-28 DIAGNOSIS — Y939 Activity, unspecified: Secondary | ICD-10-CM | POA: Diagnosis not present

## 2016-03-28 DIAGNOSIS — M62830 Muscle spasm of back: Secondary | ICD-10-CM | POA: Insufficient documentation

## 2016-03-28 DIAGNOSIS — Y929 Unspecified place or not applicable: Secondary | ICD-10-CM | POA: Insufficient documentation

## 2016-03-28 DIAGNOSIS — S29012A Strain of muscle and tendon of back wall of thorax, initial encounter: Secondary | ICD-10-CM

## 2016-03-28 NOTE — ED Notes (Signed)
OB nurse at bedside 

## 2016-03-28 NOTE — ED Notes (Signed)
Rapid OB called  

## 2016-03-28 NOTE — ED Provider Notes (Signed)
[facility] DEPT Provider Note   CSN: [id number] Arrival date & time: 03/28/16  2125     History   Chief Complaint Chief Complaint  Patient presents with  . Shortness of Breath  . Abdominal Pain    HPI Cathy Benjamin is a 25 y.o. female.  HPI 25 year old G2 P1 at 25 weeks presents to the ED with left upper back pain that started this morning, has been constant in nature, exacerbated with movement and palpation. Pain radiates from left upper back all the lateral aspect of the left thorax. She relates the shortness of breath to being pregnant and there is no acute exacerbation or worsening of this. The abdominal pain noted above is actually the left thorax pain. She currently denies any substernal chest pain, worsening shortness of breath, nausea, vomiting, abdominal pain, contractions, vaginal bleeding, dysuria.  She denies any prior history of DVTs or PEs, no recent surgeries or travel, no hormone replacement therapies, no autoimmune or hypercoagulable disorders..  Past Medical History:  Diagnosis Date  . Headache     Patient Active Problem List   Diagnosis Date Noted  . Fetal intolerance to labor, delivered, current hospitalization 11/20/2014  . S/P cesarean section 11/20/2014  . Pregnancy 11/16/2014  . Decreased fetal movement 11/04/2014    Past Surgical History:  Procedure Laterality Date  . CESAREAN SECTION N/A 11/17/2014   Procedure: CESAREAN SECTION;  Surgeon: Honor Loh Ward, MD;  Location: [facility] ORS;  Service: Obstetrics;  Laterality: N/A;  . LASIK    . NO PAST SURGERIES      OB History    Gravida Para Term Preterm AB Living   3 0 0 0 1 0   SAB TAB Ectopic Multiple Live Births   1 0 0 0         Home Medications    Prior to Admission medications   Medication Sig Start Date End Date Taking? Authorizing Provider  Prenatal Vit-Fe Fumarate-FA (PRENATAL MULTIVITAMIN) TABS tablet Take 1 tablet by mouth daily at 12 noon.   Yes Historical Provider, MD    acetaminophen (TYLENOL) 325 MG tablet Take 2 tablets (650 mg total) by mouth every 6 (six) hours. Patient not taking: Reported on 03/28/2016 11/20/14   Louisa Second, CNM  ibuprofen (ADVIL,MOTRIN) 600 MG tablet Take 1 tablet (600 mg total) by mouth every 6 (six) hours as needed. Patient not taking: Reported on 03/28/2016 11/20/14   Louisa Second, CNM  oxyCODONE 10 MG TABS Take 0.5-1 tablets (5-10 mg total) by mouth every 4 (four) hours as needed for moderate pain or severe pain. Patient not taking: Reported on 03/28/2016 11/20/14   Louisa Second, CNM    Family History No family history on file.  Social History Social History  Substance Use Topics  . Smoking status: Former Research scientist (life sciences)  . Smokeless tobacco: Never Used  . Alcohol use No     Allergies   Patient has no known allergies.   Review of Systems Review of Systems  Constitutional: Negative for chills and fever.  HENT: Negative for ear pain and sore throat.   Eyes: Negative for pain and visual disturbance.  Respiratory: Negative for cough and shortness of breath.   Cardiovascular: Negative for chest pain and palpitations.  Gastrointestinal: Negative for abdominal pain and vomiting.  Genitourinary: Negative for dysuria and hematuria.  Musculoskeletal: Positive for back pain. Negative for arthralgias.  Skin: Negative for color change and rash.  Neurological: Negative for seizures and syncope.  All other systems reviewed and are negative.  Physical Exam Updated Vital Signs BP 117/83 (BP Location: Left Arm)   Pulse 99   Temp 98.2 F (36.8 C) (Oral)   Resp 16   Ht 5\' 1"  (1.549 m)   Wt 173 lb (78.5 kg)   SpO2 98%   BMI 32.69 kg/m   Physical Exam  Constitutional: She is oriented to person, place, and time. She appears well-developed and well-nourished. No distress.  HENT:  Head: Normocephalic and atraumatic.  Nose: Nose normal.  Eyes: Conjunctivae and EOM are normal. Pupils are equal, round, and reactive to  light. Right eye exhibits no discharge. Left eye exhibits no discharge. No scleral icterus.  Neck: Normal range of motion. Neck supple.  Cardiovascular: Normal rate and regular rhythm.  Exam reveals no gallop and no friction rub.   No murmur heard. Pulmonary/Chest: Effort normal and breath sounds normal. No stridor. No respiratory distress. She has no rales.  Abdominal: Soft. She exhibits no distension. There is no tenderness.  Musculoskeletal: She exhibits no edema.       Cervical back: She exhibits tenderness. She exhibits no bony tenderness.       Back:  Neurological: She is alert and oriented to person, place, and time.  Skin: Skin is warm and dry. No rash noted. She is not diaphoretic. No erythema.  Psychiatric: She has a normal mood and affect.  Vitals reviewed.    ED Treatments / Results  Labs (all labs ordered are listed, but only abnormal results are displayed) Labs Reviewed - No data to display  EKG  EKG Interpretation  Date/Time:  Saturday March 28 2016 21:38:10 EDT Ventricular Rate:  96 PR Interval:    QRS Duration: 79 QT Interval:  338 QTC Calculation: 428 R Axis:   45 Text Interpretation:  Sinus rhythm No old tracing to compare Confirmed by [place] [id number]) on 03/28/2016 10:55:49 PM       Radiology No results found.  Procedures Procedures (including critical care time)  Medications Ordered in ED Medications - No data to display   Initial Impression / Assessment and Plan / ED Course  I have reviewed the triage vital signs and the nursing notes.  Pertinent labs & imaging results that were available during my care of the patient were reviewed by me and considered in my medical decision making (see chart for details).  Clinical Course     Given the history and exam findings this is likely secondary to muscle strain/spasm of the left parascapular muscles. Low suspicion for pulmonary embolism. Low suspicion for pneumonia.   Rapid response OB  nurse was available and fetal monitoring was reassuring. Per OB, diffuse wheezes and good health and cleared from their standpoint.  Patient was monitored on pulse ox and cardiac monitoring with no evidence of tachycardia or hypoxia. She is well-appearing, well-hydrated, nontoxic, in no acute distress.  She declined any pain medication. Supportive therapy instructions given.  She is safe for discharge with strict return precautions.  Final Clinical Impressions(s) / ED Diagnoses   Final diagnoses:  Muscle strain of left upper back, initial encounter  Muscle spasm of back   Disposition: Discharge  Condition: Good  I have discussed the results, Dx and Tx plan with the patient who expressed understanding and agree(s) with the plan. Discharge instructions discussed at great length. The patient was given strict return precautions who verbalized understanding of the instructions. No further questions at time of discharge.    Discharge Medication List as of 03/28/2016 11:31 PM  Follow Up: Primary care provider  Schedule an appointment as soon as possible for a visit  As needed      Fatima Blank, MD 03/29/16 1431

## 2016-03-28 NOTE — ED Notes (Signed)
ED Provider at bedside. 

## 2016-03-28 NOTE — ED Triage Notes (Signed)
Pt states that she is [redacted] weeks pregnant and woke up this am with LUQ abd pain that radiates to her back; pt states that the pain is worse with movement, sitting or "doing anything"; pt also c/o shortness of breath; pt states that she feel like she cannot take a deep breath and states it gets worse with exertion; pt denies injury; pt denies vaginal bleeding or discharge; pt reports minimal fetal activity today

## 2016-04-03 LAB — OB RESULTS CONSOLE HEPATITIS B SURFACE ANTIGEN: HEP B S AG: NEGATIVE

## 2016-04-03 LAB — OB RESULTS CONSOLE HIV ANTIBODY (ROUTINE TESTING): HIV: NONREACTIVE

## 2016-04-03 LAB — OB RESULTS CONSOLE RUBELLA ANTIBODY, IGM: RUBELLA: IMMUNE

## 2016-05-25 HISTORY — DX: Maternal care for unspecified type scar from previous cesarean delivery: O34.219

## 2016-05-25 NOTE — L&D Delivery Note (Signed)
Delivery Note Pt presented got her epidural, after bradycardia.  Finally relieved with medicine for BP, also rapid cervical change.  Progressed and rapidly delivered.  At 8:43 PM a viable and healthy female was delivered via Vaginal, Spontaneous Delivery (Presentation: OA; LOT).  APGAR: 9, 9; weight P.    Placenta status: delivered, intact .  Cord: 3V with the following complications: none.  Anesthesia:  epidural Episiotomy: None Lacerations: B labial Suture Repair: 3.0 vicryl rapide Est. Blood Loss (mL): 200cc  Mom to postpartum.  Baby to Couplet care / Skin to Skin.  Bovard-Stuckert, Levone Otten 07/05/2016, 9:11 PM  Br/RI/Tdap in PNC/Contra IUD/A+

## 2016-06-15 ENCOUNTER — Encounter (HOSPITAL_COMMUNITY): Payer: Self-pay | Admitting: *Deleted

## 2016-06-15 ENCOUNTER — Inpatient Hospital Stay (HOSPITAL_COMMUNITY)
Admission: AD | Admit: 2016-06-15 | Discharge: 2016-06-15 | Disposition: A | Discharge status: Home / Self Care | Payer: Medicaid Other | Source: Ambulatory Visit | Attending: Obstetrics and Gynecology | Admitting: Obstetrics and Gynecology

## 2016-06-15 DIAGNOSIS — Z0371 Encounter for suspected problem with amniotic cavity and membrane ruled out: Secondary | ICD-10-CM

## 2016-06-15 DIAGNOSIS — Z3A36 36 weeks gestation of pregnancy: Secondary | ICD-10-CM | POA: Insufficient documentation

## 2016-06-15 DIAGNOSIS — Z3493 Encounter for supervision of normal pregnancy, unspecified, third trimester: Secondary | ICD-10-CM | POA: Insufficient documentation

## 2016-06-15 LAB — WET PREP, GENITAL
Clue Cells Wet Prep HPF POC: NONE SEEN
Sperm: NONE SEEN
TRICH WET PREP: NONE SEEN
YEAST WET PREP: NONE SEEN

## 2016-06-15 LAB — GROUP B STREP BY PCR: Group B strep by PCR: NEGATIVE

## 2016-06-15 LAB — OB RESULTS CONSOLE GBS: GBS: POSITIVE

## 2016-06-15 LAB — AMNISURE RUPTURE OF MEMBRANE (ROM) NOT AT ARMC: Amnisure ROM: NEGATIVE

## 2016-06-15 NOTE — MAU Note (Signed)
PT  SAYS SHE  THINKS SROM AT 830AM-   WHILE SHE WAS WALKING  SHE HAD FLUID  SOAKED  HER UNDERWEAR  .  AND  HAS  CONTINUED  TO HAVE  FLUID.   HAS HAD  SOME UC.Marland Kitchen  NO VE IN OFFICE.    DENIES HSV AND MRSA.       GBS- NOT  DONE  YET.

## 2016-06-15 NOTE — MAU Provider Note (Signed)
Chief Complaint:  Rupture of Membranes   None     HPI: Cathy Benjamin is a 26 y.o. G3P0010 at [redacted]w[redacted]d who presents to maternity admissions reporting gush of fluid while walking at work this morning and continuous trickle of fluid since then. She describes the fluid as clear and without odor.  She has not worn a pad for the leaking but reports her underwear has been wet 2-3 times.  The leaking is associated with mild irregular contractions that start in her back and radiate around to her lower abdomen.  Walking makes the leaking worse, nothing makes it better.  Nothing makes her cramping better or worse. She reports good fetal movement, denies vaginal bleeding, vaginal itching/burning, urinary symptoms, h/a, dizziness, n/v, or fever/chills.    HPI  Past Medical History: Past Medical History:  Diagnosis Date  . Headache     Past obstetric history: OB History  Gravida Para Term Preterm AB Living  3 0 0 0 1 0  SAB TAB Ectopic Multiple Live Births  1 0 0 0      # Outcome Date GA Lbr Len/2nd Weight Sex Delivery Anes PTL Lv  3 Current           2 SAB           1 Gravida      CS-[id number]         Past Surgical History: Past Surgical History:  Procedure Laterality Date  . CESAREAN SECTION N/A 11/17/2014   Procedure: CESAREAN SECTION;  Surgeon: Honor Loh Ward, MD;  Location: [facility] ORS;  Service: Obstetrics;  Laterality: N/A;  . LASIK    . NO PAST SURGERIES      Family History: History reviewed. No pertinent family history.  Social History: Social History  Substance Use Topics  . Smoking status: Former Research scientist (life sciences)  . Smokeless tobacco: Never Used  . Alcohol use No    Allergies: No Known Allergies  Meds:  Prescriptions Prior to Admission  Medication Sig Dispense Refill Last Dose  . Prenatal Vit-Fe Fumarate-FA (PRENATAL MULTIVITAMIN) TABS tablet Take 1 tablet by mouth daily at 12 noon.   06/15/2016 at Unknown time  . ibuprofen (ADVIL,MOTRIN) 600 MG tablet Take 1 tablet (600 mg total) by  mouth every 6 (six) hours as needed. (Patient not taking: Reported on 03/28/2016) 40 tablet 1 Not Taking at Unknown time    ROS:  Review of Systems  Constitutional: Negative for chills, fatigue and fever.  Eyes: Negative for visual disturbance.  Respiratory: Negative for shortness of breath.   Cardiovascular: Negative for chest pain.  Gastrointestinal: Negative for abdominal pain, nausea and vomiting.  Genitourinary: Negative for difficulty urinating, dysuria, flank pain, pelvic pain, vaginal bleeding, vaginal discharge and vaginal pain.  Neurological: Negative for dizziness and headaches.  Psychiatric/Behavioral: Negative.      I have reviewed patient's Past Medical Hx, Surgical Hx, Family Hx, Social Hx, medications and allergies.   Physical Exam   Patient Vitals for the past 24 hrs:  BP Temp Temp src Pulse Resp Height Weight  06/15/16 2007 131/81 97.9 F (36.6 C) Oral 90 20 5\' 1"  (1.549 m) 170 lb 12 oz (77.5 kg)   Constitutional: Well-developed, well-nourished female in no acute distress.  Cardiovascular: normal rate Respiratory: normal effort GI: Abd soft, non-tender, gravid appropriate for gestational age.  MS: Extremities nontender, no edema, normal ROM Neurologic: Alert and oriented x 4.  GU: Neg CVAT.  PELVIC EXAM: Cervix pink, visually closed, without lesion, scant white creamy discharge,  vaginal walls and external genitalia normal Bimanual exam: Cervix 0/long/high, firm, anterior, neg CMT, uterus nontender, nonenlarged, adnexa without tenderness, enlargement, or mass  Dilation: 1 Effacement (%): Thick Cervical Position: Posterior Presentation: Vertex  FHT:  Baseline 120 , moderate variability, accelerations present, no decelerations Contractions: irregular 2-10 minutes, mild to palpation   Labs: Results for orders placed or performed during the hospital encounter of 06/15/16 (from the past 24 hour(s))  Amnisure rupture of membrane (rom)not at [facility]     Status:  None   Collection Time: 06/15/16  8:46 PM  Result Value Ref Range   Amnisure ROM NEGATIVE   Wet prep, genital     Status: Abnormal   Collection Time: 06/15/16  8:48 PM  Result Value Ref Range   Yeast Wet Prep HPF POC NONE SEEN NONE SEEN   Trich, Wet Prep NONE SEEN NONE SEEN   Clue Cells Wet Prep HPF POC NONE SEEN NONE SEEN   WBC, Wet Prep HPF POC MODERATE (A) NONE SEEN   Sperm NONE SEEN       Imaging:  No results found.  MAU Course/MDM: I have ordered labs and reviewed results.  NST reviewed Consult Dr Marvel Plan with presentation, exam findings and test results.  No evidence of preterm labor or premature rupture of membranes today. Pt stable at time of discharge.  Today's evaluation included a work-up for preterm labor which can be life-threatening for both mom and baby.  Assessment: 1. Encounter for suspected premature rupture of membranes, with rupture of membranes not found   2. [redacted] weeks gestation of pregnancy     Plan: Discharge home Labor precautions and fetal kick counts Follow-up Information    Logan Bores, MD Follow up.   Specialty:  Obstetrics and Gynecology Contact information: [address] [place] [address] [phone]          Allergies as of 06/15/2016   No Known Allergies     Medication List    STOP taking these medications   ibuprofen 600 MG tablet Commonly known as:  ADVIL,MOTRIN     TAKE these medications   prenatal multivitamin Tabs tablet Take 1 tablet by mouth daily at 12 noon.       Fatima Blank Certified Nurse-Midwife 06/15/2016 9:25 PM

## 2016-06-17 LAB — CULTURE, BETA STREP (GROUP B ONLY)

## 2016-07-01 ENCOUNTER — Inpatient Hospital Stay (HOSPITAL_COMMUNITY)
Admission: AD | Admit: 2016-07-01 | Discharge: 2016-07-02 | Disposition: A | Discharge status: Home / Self Care | Payer: BC Managed Care – PPO | Source: Ambulatory Visit | Attending: Obstetrics and Gynecology | Admitting: Obstetrics and Gynecology

## 2016-07-01 ENCOUNTER — Encounter (HOSPITAL_COMMUNITY): Payer: Self-pay

## 2016-07-01 DIAGNOSIS — Z3493 Encounter for supervision of normal pregnancy, unspecified, third trimester: Secondary | ICD-10-CM | POA: Insufficient documentation

## 2016-07-01 NOTE — MAU Note (Signed)
Pt c/o contractions every 5-6 mins since 8pm. Rates 7/10. Denies vag bleeding or LOF. +FM.

## 2016-07-02 ENCOUNTER — Encounter (HOSPITAL_COMMUNITY): Payer: Self-pay

## 2016-07-02 NOTE — Discharge Instructions (Signed)

## 2016-07-02 NOTE — MAU Note (Signed)
Notified provider that patient is here for a labor eval , Patient is unchanged after an our 3/80/-2. Provider said to discharge patient with labor precautions.

## 2016-07-05 ENCOUNTER — Inpatient Hospital Stay (HOSPITAL_COMMUNITY): Payer: BC Managed Care – PPO | Admitting: Anesthesiology

## 2016-07-05 ENCOUNTER — Encounter (HOSPITAL_COMMUNITY): Payer: Self-pay | Admitting: *Deleted

## 2016-07-05 ENCOUNTER — Inpatient Hospital Stay (HOSPITAL_COMMUNITY)
Admission: AD | Admit: 2016-07-05 | Discharge: 2016-07-07 | DRG: 775 | Disposition: A | Discharge status: Home / Self Care | Payer: BC Managed Care – PPO | Source: Ambulatory Visit | Attending: Obstetrics and Gynecology | Admitting: Obstetrics and Gynecology

## 2016-07-05 DIAGNOSIS — Z3493 Encounter for supervision of normal pregnancy, unspecified, third trimester: Secondary | ICD-10-CM | POA: Diagnosis present

## 2016-07-05 DIAGNOSIS — O34211 Maternal care for low transverse scar from previous cesarean delivery: Secondary | ICD-10-CM | POA: Diagnosis present

## 2016-07-05 DIAGNOSIS — O99824 Streptococcus B carrier state complicating childbirth: Secondary | ICD-10-CM | POA: Diagnosis present

## 2016-07-05 DIAGNOSIS — Z3A39 39 weeks gestation of pregnancy: Secondary | ICD-10-CM | POA: Diagnosis not present

## 2016-07-05 DIAGNOSIS — Z87891 Personal history of nicotine dependence: Secondary | ICD-10-CM

## 2016-07-05 DIAGNOSIS — Z833 Family history of diabetes mellitus: Secondary | ICD-10-CM

## 2016-07-05 DIAGNOSIS — O34219 Maternal care for unspecified type scar from previous cesarean delivery: Secondary | ICD-10-CM

## 2016-07-05 LAB — CBC
HEMATOCRIT: 36.1 % (ref 36.0–46.0)
Hemoglobin: 12.4 g/dL (ref 12.0–15.0)
MCH: 28.7 pg (ref 26.0–34.0)
MCHC: 34.3 g/dL (ref 30.0–36.0)
MCV: 83.6 fL (ref 78.0–100.0)
PLATELETS: 225 10*3/uL (ref 150–400)
RBC: 4.32 MIL/uL (ref 3.87–5.11)
RDW: 13.6 % (ref 11.5–15.5)
WBC: 7.7 10*3/uL (ref 4.0–10.5)

## 2016-07-05 LAB — TYPE AND SCREEN
ABO/RH(D): A POS
ANTIBODY SCREEN: NEGATIVE

## 2016-07-05 MED ORDER — ACETAMINOPHEN 325 MG PO TABS: 650.0000 mg | ORAL_TABLET | ORAL | Status: DC | PRN

## 2016-07-05 MED ORDER — LACTATED RINGERS IV SOLN: INTRAVENOUS | Status: DC

## 2016-07-05 MED ORDER — OXYTOCIN BOLUS FROM INFUSION
500.0000 mL | Freq: Once | INTRAVENOUS | Status: AC
  Administered 2016-07-05: 500 mL via INTRAVENOUS

## 2016-07-05 MED ORDER — ONDANSETRON HCL 4 MG/2ML IJ SOLN: 4.0000 mg | INTRAMUSCULAR | Status: DC | PRN

## 2016-07-05 MED ORDER — DIPHENHYDRAMINE HCL 25 MG PO CAPS: 25.0000 mg | ORAL_CAPSULE | Freq: Four times a day (QID) | ORAL | Status: DC | PRN

## 2016-07-05 MED ORDER — EPHEDRINE 5 MG/ML INJ
10.0000 mg | INTRAVENOUS | Status: DC | PRN
  Filled 2016-07-05: qty 4

## 2016-07-05 MED ORDER — PENICILLIN G POTASSIUM 5000000 UNITS IJ SOLR
5.0000 10*6.[IU] | Freq: Once | INTRAVENOUS | Status: DC
  Filled 2016-07-05: qty 5

## 2016-07-05 MED ORDER — EPHEDRINE 5 MG/ML INJ
10.0000 mg | INTRAVENOUS | Status: DC | PRN
  Filled 2016-07-05 (×2): qty 4

## 2016-07-05 MED ORDER — WITCH HAZEL-GLYCERIN EX PADS
1.0000 | MEDICATED_PAD | CUTANEOUS | Status: DC | PRN
Start: 2016-07-05 — End: 2016-07-07

## 2016-07-05 MED ORDER — LIDOCAINE HCL (PF) 1 % IJ SOLN
INTRAMUSCULAR | Status: DC | PRN
  Administered 2016-07-05: 8 mL via EPIDURAL
  Administered 2016-07-05: 7 mL via EPIDURAL

## 2016-07-05 MED ORDER — SODIUM CHLORIDE 0.9 % IV SOLN: 2.0000 g | Freq: Once | INTRAVENOUS | Status: DC

## 2016-07-05 MED ORDER — OXYTOCIN 40 UNITS IN LACTATED RINGERS INFUSION - SIMPLE MED
2.5000 [IU]/h | INTRAVENOUS | Status: DC
  Filled 2016-07-05: qty 1000

## 2016-07-05 MED ORDER — PRENATAL MULTIVITAMIN CH
1.0000 | ORAL_TABLET | Freq: Every day | ORAL | Status: DC
  Administered 2016-07-06 – 2016-07-07 (×2): 1 via ORAL
  Filled 2016-07-05 (×2): qty 1

## 2016-07-05 MED ORDER — ZOLPIDEM TARTRATE 5 MG PO TABS
5.0000 mg | ORAL_TABLET | Freq: Every evening | ORAL | Status: DC | PRN
Start: 2016-07-05 — End: 2016-07-07

## 2016-07-05 MED ORDER — BUTORPHANOL TARTRATE 1 MG/ML IJ SOLN
1.0000 mg | INTRAMUSCULAR | Status: DC | PRN
  Filled 2016-07-05: qty 1

## 2016-07-05 MED ORDER — FENTANYL 2.5 MCG/ML BUPIVACAINE 1/10 % EPIDURAL INFUSION (WH - ANES)
14.0000 mL/h | INTRAMUSCULAR | Status: DC | PRN
  Administered 2016-07-05 (×2): 14 mL/h via EPIDURAL
  Filled 2016-07-05: qty 100

## 2016-07-05 MED ORDER — SENNOSIDES-DOCUSATE SODIUM 8.6-50 MG PO TABS
2.0000 | ORAL_TABLET | ORAL | Status: DC
Start: 2016-07-06 — End: 2016-07-07
  Administered 2016-07-06 – 2016-07-07 (×2): 2 via ORAL
  Filled 2016-07-05 (×2): qty 2

## 2016-07-05 MED ORDER — DIPHENHYDRAMINE HCL 50 MG/ML IJ SOLN: 12.5000 mg | INTRAMUSCULAR | Status: DC | PRN

## 2016-07-05 MED ORDER — ONDANSETRON HCL 4 MG/2ML IJ SOLN: 4.0000 mg | Freq: Four times a day (QID) | INTRAMUSCULAR | Status: DC | PRN

## 2016-07-05 MED ORDER — PENICILLIN G POT IN DEXTROSE 60000 UNIT/ML IV SOLN
3.0000 10*6.[IU] | INTRAVENOUS | Status: DC
  Filled 2016-07-05: qty 50

## 2016-07-05 MED ORDER — OXYCODONE-ACETAMINOPHEN 5-325 MG PO TABS: 1.0000 | ORAL_TABLET | ORAL | Status: DC | PRN

## 2016-07-05 MED ORDER — IBUPROFEN 600 MG PO TABS
600.0000 mg | ORAL_TABLET | Freq: Four times a day (QID) | ORAL | Status: DC
Start: 2016-07-06 — End: 2016-07-07
  Administered 2016-07-06 – 2016-07-07 (×7): 600 mg via ORAL
  Filled 2016-07-05 (×7): qty 1

## 2016-07-05 MED ORDER — LIDOCAINE HCL (PF) 1 % IJ SOLN
30.0000 mL | INTRAMUSCULAR | Status: DC | PRN
  Administered 2016-07-05: 30 mL via SUBCUTANEOUS
  Filled 2016-07-05: qty 30

## 2016-07-05 MED ORDER — OXYCODONE-ACETAMINOPHEN 5-325 MG PO TABS: 2.0000 | ORAL_TABLET | ORAL | Status: DC | PRN

## 2016-07-05 MED ORDER — OXYCODONE HCL 5 MG PO TABS: 5.0000 mg | ORAL_TABLET | ORAL | Status: DC | PRN

## 2016-07-05 MED ORDER — ONDANSETRON HCL 4 MG PO TABS: 4.0000 mg | ORAL_TABLET | ORAL | Status: DC | PRN

## 2016-07-05 MED ORDER — OXYCODONE HCL 5 MG PO TABS: 10.0000 mg | ORAL_TABLET | ORAL | Status: DC | PRN

## 2016-07-05 MED ORDER — SIMETHICONE 80 MG PO CHEW: 80.0000 mg | CHEWABLE_TABLET | ORAL | Status: DC | PRN

## 2016-07-05 MED ORDER — BENZOCAINE-MENTHOL 20-0.5 % EX AERO
1.0000 "application " | INHALATION_SPRAY | CUTANEOUS | Status: DC | PRN
  Filled 2016-07-05: qty 56

## 2016-07-05 MED ORDER — DIBUCAINE 1 % RE OINT: 1.0000 "application " | TOPICAL_OINTMENT | RECTAL | Status: DC | PRN

## 2016-07-05 MED ORDER — COCONUT OIL OIL: 1.0000 "application " | TOPICAL_OIL | Status: DC | PRN

## 2016-07-05 MED ORDER — PHENYLEPHRINE 40 MCG/ML (10ML) SYRINGE FOR IV PUSH (FOR BLOOD PRESSURE SUPPORT)
80.0000 ug | PREFILLED_SYRINGE | INTRAVENOUS | Status: DC | PRN
  Filled 2016-07-05: qty 10
  Filled 2016-07-05: qty 5

## 2016-07-05 MED ORDER — ACETAMINOPHEN 325 MG PO TABS
650.0000 mg | ORAL_TABLET | ORAL | Status: DC | PRN
  Administered 2016-07-06 (×2): 650 mg via ORAL
  Filled 2016-07-05 (×2): qty 2

## 2016-07-05 MED ORDER — SOD CITRATE-CITRIC ACID 500-334 MG/5ML PO SOLN
30.0000 mL | ORAL | Status: DC | PRN
Start: 2016-07-05 — End: 2016-07-05

## 2016-07-05 MED ORDER — LACTATED RINGERS IV SOLN
500.0000 mL | Freq: Once | INTRAVENOUS | Status: AC
  Administered 2016-07-05: 500 mL via INTRAVENOUS

## 2016-07-05 MED ORDER — PHENYLEPHRINE 40 MCG/ML (10ML) SYRINGE FOR IV PUSH (FOR BLOOD PRESSURE SUPPORT)
80.0000 ug | PREFILLED_SYRINGE | INTRAVENOUS | Status: DC | PRN
  Administered 2016-07-05 (×2): 80 ug via INTRAVENOUS
  Filled 2016-07-05: qty 5

## 2016-07-05 MED ORDER — FLEET ENEMA 7-19 GM/118ML RE ENEM: 1.0000 | ENEMA | RECTAL | Status: DC | PRN

## 2016-07-05 MED ORDER — SODIUM CHLORIDE 0.9 % IV SOLN
2.0000 g | Freq: Four times a day (QID) | INTRAVENOUS | Status: DC
  Administered 2016-07-05: 2 g via INTRAVENOUS
  Filled 2016-07-05 (×2): qty 2000

## 2016-07-05 MED ORDER — LACTATED RINGERS IV SOLN
500.0000 mL | INTRAVENOUS | Status: DC | PRN
  Administered 2016-07-05: 500 mL via INTRAVENOUS
  Administered 2016-07-05: 400 mL via INTRAVENOUS

## 2016-07-05 NOTE — MAU Note (Signed)
C/o ucs since 1600 this AM; c/o SROM 2 0600 this AM;

## 2016-07-05 NOTE — Anesthesia Procedure Notes (Signed)
Epidural Patient location during procedure: OB Start time: 07/05/2016 7:04 PM End time: 07/05/2016 7:07 PM  Staffing Anesthesiologist: Lyn Hollingshead Performed: anesthesiologist   Preanesthetic Checklist Completed: patient identified, surgical consent, pre-op evaluation, timeout performed, IV checked, risks and benefits discussed and monitors and equipment checked  Epidural Patient position: sitting Prep: site prepped and draped and DuraPrep Patient monitoring: continuous pulse ox and blood pressure Approach: midline Location: L3-L4 Injection technique: LOR air  Needle:  Needle type: Tuohy  Needle gauge: 17 G Needle length: 9 cm and 9 Needle insertion depth: 5 cm cm Catheter type: closed end flexible Catheter size: 19 Gauge Catheter at skin depth: 10 cm Test dose: negative and Other  Assessment Sensory level: T9 Events: blood not aspirated, injection not painful, no injection resistance, negative IV test and no paresthesia  Additional Notes Reason for block:procedure for pain

## 2016-07-05 NOTE — H&P (Signed)
Cathy Benjamin is a 26 y.o. female [phone] at 39wk with ctx and questionable loss of fluid.  Cervical change in MAU, admitted and rapidly progressed and delivered.  She had some bradycardia with decreased BP and rapid descent - but progressed and recovered to 120's.  She declined genetic screening.  She was a late entry to care at 25 week.  Also history of LTCS - Pt desires TOLAC - d/w pt r/b/a - wishes to proceed.  Got Ampicillin for delivery.    OB History    Gravida Para Term Preterm AB Living   4 1 1  0 1 1   SAB TAB Ectopic Multiple Live Births   1 0 0 0 1    SAB, TAB SVD 6#10 41wk LTCS,  Present no complications No abn pap No STD  Past Medical History:  Diagnosis Date  . Headache   . SVD (spontaneous vaginal delivery) 07/05/2016   Past Surgical History:  Procedure Laterality Date  . CESAREAN SECTION N/A 11/17/2014   Procedure: CESAREAN SECTION;  Surgeon: Cathy Loh Ward, MD;  Location: [facility] ORS;  Service: Obstetrics;  Laterality: N/A;  . LASIK     Family History: DM, lung CA Social History:  reports that she has quit smoking. She has quit using smokeless tobacco. She reports that she does not drink alcohol or use drugs. single  Meds PNV All NKDA     Maternal Diabetes: No Genetic Screening: Declined Maternal Ultrasounds/Referrals: Normal Fetal Ultrasounds or other Referrals:  None Maternal Substance Abuse:  Yes:  Type: Other: h/o tobacco Significant Maternal Medications:  None Significant Maternal Lab Results:  Lab values include: Group B Strep positive Other Comments:  None  Review of Systems  Constitutional: Negative.   HENT: Negative.   Eyes: Negative.   Respiratory: Negative.   Cardiovascular: Negative.   Gastrointestinal: Negative.   Genitourinary: Negative.   Musculoskeletal: Negative.   Skin: Negative.   Neurological: Negative.   Psychiatric/Behavioral: Negative.    Maternal Medical History:  Reason for admission: Contractions.   Contractions:  Frequency: regular.   Perceived severity is moderate.    Fetal activity: Perceived fetal activity is normal.    Prenatal Complications - Diabetes: none.    Dilation: 10 Effacement (%): 100 Station: +2 Exam by:: Cathy Cole, RN Blood pressure (!) 136/58, pulse (!) 111, temperature 98.9 F (37.2 C), temperature source Oral, resp. rate 20, height 5\' 2"  (1.575 m), weight 77.1 kg (170 lb), unknown if currently breastfeeding. Maternal Exam:  Uterine Assessment: Contraction frequency is regular.   Abdomen: Patient reports no abdominal tenderness. Surgical scars: low transverse.   Fundal height is appropriate for gestation.   Estimated fetal weight is 7#.   Fetal presentation: vertex  Introitus: Normal vulva. Normal vagina.  Pelvis: questionable for delivery.   Cervix: Cervix evaluated by digital exam.     Physical Exam  Constitutional: She is oriented to person, place, and time. She appears well-developed and well-nourished.  HENT:  Head: Normocephalic and atraumatic.  Cardiovascular: Normal rate and regular rhythm.   Respiratory: Effort normal and breath sounds normal. No respiratory distress. She has no wheezes.  GI: Soft. Bowel sounds are normal. She exhibits no distension. There is no tenderness.  Musculoskeletal: Normal range of motion.  Neurological: She is alert and oriented to person, place, and time.  Skin: Skin is warm and dry.  Psychiatric: She has a normal mood and affect. Her behavior is normal.    Prenatal labs: ABO, Rh: --/--/A POS (02/11 1825) Antibody: NEG (  02/11 1825) Rubella: Immune (11/10 0000) RPR:   NR HBsAg: Negative (11/10 0000)  HIV: Non-reactive (11/10 0000)  GBS: Positive (01/22 0000)   Ur Cx neg/ Varicella immune/Hgb 12.5/Plt 189/GC neg/Chl neg/  Flu 11/8; Tdap 12/7  [place] nl anat, female, post plac  Assessment/Plan: 26yo G4P1021 at 39 wk presents in active labor, now [id number]. Received tdap and flu Epidural for labor gbbs positive -  received ampicillin   Cathy Benjamin, Cathy Benjamin 07/05/2016, 9:21 PM

## 2016-07-05 NOTE — Anesthesia Preprocedure Evaluation (Signed)
Anesthesia Evaluation  Patient identified by MRN, date of birth, ID band Patient awake    Reviewed: Allergy & Precautions, H&P , NPO status , Patient's Chart, lab work & pertinent test results  Airway Mallampati: I  TM Distance: >3 FB Neck ROM: full    Dental no notable dental hx.    Pulmonary neg pulmonary ROS, former smoker,    Pulmonary exam normal        Cardiovascular negative cardio ROS Normal cardiovascular exam     Neuro/Psych negative psych ROS   GI/Hepatic negative GI ROS, Neg liver ROS,   Endo/Other  negative endocrine ROS  Renal/GU negative Renal ROS     Musculoskeletal   Abdominal (+) + obese,   Peds  Hematology negative hematology ROS (+)   Anesthesia Other Findings   Reproductive/Obstetrics (+) Pregnancy                             Anesthesia Physical Anesthesia Plan  ASA: II  Anesthesia Plan: Epidural   Post-op Pain Management:    Induction:   Airway Management Planned:   Additional Equipment:   Intra-op Plan:   Post-operative Plan:   Informed Consent: I have reviewed the patients History and Physical, chart, labs and discussed the procedure including the risks, benefits and alternatives for the proposed anesthesia with the patient or authorized representative who has indicated his/her understanding and acceptance.     Plan Discussed with:   Anesthesia Plan Comments:         Anesthesia Quick Evaluation

## 2016-07-05 NOTE — Lactation Note (Signed)
This note was copied from a baby's chart. Lactation Consultation Note  Patient Name: Cathy Benjamin [id number] Date: 07/05/2016 Reason for consult: Initial assessment Baby at 30 minutes of life. Mom desires to bf for 2-17m then transition to formula. She bf her older child for 2wk. She stopped because of nipple pain. She denies breast or nipple pain at this time but is worried that it will happen. Discussed baby behavior, feeding frequency, baby belly size, voids, wt loss, breast changes, and nipple care. Demonstrated manual expression, colostrum noted bilaterally. Given lactation handouts. Aware of OP services and support group.     Maternal Data Has patient been taught Hand Expression?: Yes Does the patient have breastfeeding experience prior to this delivery?: Yes  Feeding Feeding Type: Breast Fed  LATCH Score/Interventions Latch: Repeated attempts needed to sustain latch, nipple held in mouth throughout feeding, stimulation needed to elicit sucking reflex. Intervention(s): Adjust position;Breast compression;Assist with latch;Breast massage  Audible Swallowing: A few with stimulation Intervention(s): Skin to skin  Type of Nipple: Everted at rest and after stimulation  Comfort (Breast/Nipple): Soft / non-tender     Hold (Positioning): Full assist, staff holds infant at breast Intervention(s): Position options;Support Pillows  LATCH Score: 6  Lactation Tools Discussed/Used     Consult Status Consult Status: Follow-up Date: 07/06/16 Follow-up type: In-patient    Denzil Hughes 07/05/2016, 9:29 PM

## 2016-07-06 LAB — CBC
HCT: 31.9 % — ABNORMAL LOW (ref 36.0–46.0)
HEMOGLOBIN: 10.7 g/dL — AB (ref 12.0–15.0)
MCH: 28.3 pg (ref 26.0–34.0)
MCHC: 33.5 g/dL (ref 30.0–36.0)
MCV: 84.4 fL (ref 78.0–100.0)
PLATELETS: 217 10*3/uL (ref 150–400)
RBC: 3.78 MIL/uL — AB (ref 3.87–5.11)
RDW: 13.8 % (ref 11.5–15.5)
WBC: 13.6 10*3/uL — ABNORMAL HIGH (ref 4.0–10.5)

## 2016-07-06 LAB — RPR: RPR Ser Ql: NONREACTIVE

## 2016-07-06 LAB — ABO/RH: ABO/RH(D): A POS

## 2016-07-06 NOTE — Anesthesia Postprocedure Evaluation (Signed)
Anesthesia Post Note  Patient: Cathy Benjamin  Procedure(s) Performed: * No procedures listed *  Patient location during evaluation: Mother Baby Anesthesia Type: Epidural Level of consciousness: awake, awake and alert and oriented Pain management: pain level controlled Vital Signs Assessment: post-procedure vital signs reviewed and stable Respiratory status: spontaneous breathing and nonlabored ventilation Cardiovascular status: stable Postop Assessment: no headache, epidural receding, no backache, patient able to bend at knees, no signs of nausea or vomiting and adequate PO intake Anesthetic complications: no        Last Vitals:  Vitals:   07/06/16 0017 07/06/16 0422  BP: 119/70 112/62  Pulse: (!) 104 72  Resp: 20 18  Temp: (!) 38.1 C 37 C    Last Pain:  Vitals:   07/06/16 0605  TempSrc:   PainSc: 2    Pain Goal: Patients Stated Pain Goal: 0 (07/06/16 0605)               Jabier Mutton

## 2016-07-06 NOTE — Lactation Note (Signed)
This note was copied from a baby's chart. Lactation Consultation Note Follow up visit at 21 hours of age.  Mom reports baby just finished feeding of about 10 minutes on her left breast.  Baby would not latch to right breast.  Mom reports soreness to left nipple.  LC offered to assist with re-latching baby  Now that baby is more awake.  Mom declines and reports she is wanting to eat her meal.  LC advised mom to call for help with next feeding for RN or LC to observe. LC made mom aware of over night [place] services.  LC discussed with mom Football and cross cradle  positioning to help with comfort and encouraged mom to pre and post hand express.  Mom to apply EBM to nipples after feedings.    Patient Name: Cathy Benjamin [id number] Date: 07/06/2016 Reason for consult: Follow-up assessment;Breast/nipple pain;Difficult latch   Maternal Data    Feeding Feeding Type: Breast Fed Length of feed: 10 min  LATCH Score/Interventions Latch: Repeated attempts needed to sustain latch, nipple held in mouth throughout feeding, stimulation needed to elicit sucking reflex. Intervention(s): Adjust position;Assist with latch  Audible Swallowing: Spontaneous and intermittent  Type of Nipple: Everted at rest and after stimulation  Comfort (Breast/Nipple): Filling, red/small blisters or bruises, mild/mod discomfort  Problem noted: Mild/Moderate discomfort Interventions (Mild/moderate discomfort): Hand massage;Hand expression  Hold (Positioning): Assistance needed to correctly position infant at breast and maintain latch. Intervention(s): Breastfeeding basics reviewed;Position options  LATCH Score: 7  Lactation Tools Discussed/Used     Consult Status Consult Status: Follow-up Date: 07/07/16 Follow-up type: In-patient    Kendell Bane Justine Null 07/06/2016, 6:17 PM

## 2016-07-06 NOTE — Progress Notes (Signed)
Patient ID: Cathy Benjamin, female   DOB: February 01, 1991, 26 y.o.   MRN: [id number] PPD#1 Pt doing well. Has no complaints - denies VB, LOF or headaches. Ambulating and tolerating diet well. Bonding well with baby.  VSS ABD- soft, FF EXT - no homans  Hg: 10.7  A/P: PPD#1 s/p VBAC precip- stable         Routine pp care         Discharge to home tomorrow

## 2016-07-06 NOTE — Lactation Note (Signed)
This note was copied from a baby's chart. Lactation Consultation Note  Mom STS with infant.  Mom states [name] has just finished eating.  States baby fed for 25 minutes on left side.  Mom states she is having trouble feeding from the right.  Infant asleep not cueing to feed. LC offered to try to wake infant to feed but mom states she does not want to attempt to feed again at this time.  Mom and dad state that infant latches very well on left side.  LC encouraged mom to call out with next feed on right side.  BF basics reviewed.  LC reviewed and demonstrated hand expression with mom with patient's permission.  Mom had several gtts of colostrum flow easily from left side.  When [facility] demonstrated hand expression on right, gtts of colostrum were noted.  This encouraged mom greatly.  LC encouraged mom to continue to do STS with infant and to feed infant a minimum of 8-12 times in 24 hours.  Mom states that she will call out for help when feeding next on the right side.  LC discussed different position options with family and emphasized the importance of pillow/blanket support when bf.     Patient Name: Cathy Benjamin [id number] Date: 07/06/2016 Reason for consult: Follow-up assessment   Maternal Data Formula Feeding for Exclusion: No  Feeding Feeding Type: Breast Fed Length of feed: 25 min (per mom)  LATCH Score/Interventions Latch: Too sleepy or reluctant, no latch achieved, no sucking elicited. Intervention(s): Skin to skin;Teach feeding cues;Waking techniques  Audible Swallowing: None Intervention(s): Hand expression  Type of Nipple: Everted at rest and after stimulation  Comfort (Breast/Nipple): Soft / non-tender     Hold (Positioning): No assistance needed to correctly position infant at breast.  LATCH Score: 6  Lactation Tools Discussed/Used     Consult Status Consult Status: Follow-up Date: 07/07/16 Follow-up type: In-patient    Ferne Coe [facility] 07/06/2016,  1:15 PM

## 2016-07-06 NOTE — Plan of Care (Signed)
Problem: Nutritional: Goal: Mothers verbalization of comfort with breastfeeding process will improve Outcome: Completed/Met Date Met: 07/06/16 Encouraged mother to call for latch assessment. Mother states that baby is latching well to left side, but having difficulty latching to right side.

## 2016-07-07 ENCOUNTER — Encounter (HOSPITAL_COMMUNITY): Payer: Self-pay | Admitting: *Deleted

## 2016-07-07 MED ORDER — IBUPROFEN 600 MG PO TABS: 600.0000 mg | ORAL_TABLET | Freq: Four times a day (QID) | ORAL | 0 refills | Status: DC

## 2016-07-07 NOTE — Lactation Note (Signed)
This note was copied from a baby's chart. Lactation Consultation Note LC entered rm. Noted bottle of formula on over bed table. Asked mom if she was breast/formula, mom stated yes that her nipples hurt to bad.  Asked to assess breast. Noted breast filling w/knots, nipples flat w/slight bump of the nipple. Nipples red w/slight bruise Lt. bottom nipple to Lt.  Baby unable to obtain a deep latch. Maybe d/t breast filling that nipples weren't everted much. Mom stated they are not usually everted a lot, mainly flat. Mom holding baby STS. Praised mom.  Asked FOB to hold baby while [place] worked w/mom to prevent engorgement. Educated on engorgement.  Mom shown how to use DEBP & how to disassemble, clean, & reassemble parts. Mom knows to pump q3h for 15-20 min. Mom pumped 15 ml colostrum. Gave to baby after BF. Noted Lt. Breast didn't collect as much as Rt. Breast massaged breast to aide in drainage. Noted red small blood blister size of point of pencil. Bruising to underside of Lt nipple. Fitted mom w/#20 NS. Mom stated much better, and comfort. Noted scant colostrum in nipple shield w/some blood in it.  Fitted mom w/24 NS. Noted colostrum puddled in NS. Mom stated much better.  Comfort gels given and applied. NS application and care.  Educated on newborn behavior, I&O, cluster feeding, supply and demand.   Discussed BF positions, assisted in football position.  Patient Name: Cathy Benjamin [id number] Date: 07/07/2016 Reason for consult: Follow-up assessment;Breast/nipple pain;Infant weight loss;Difficult latch   Maternal Data    Feeding Feeding Type: Breast Fed Length of feed: 30 min  LATCH Score/Interventions Latch: Repeated attempts needed to sustain latch, nipple held in mouth throughout feeding, stimulation needed to elicit sucking reflex. Intervention(s): Skin to skin;Teach feeding cues;Waking techniques Intervention(s): Adjust position;Assist with latch;Breast massage;Breast  compression  Audible Swallowing: Spontaneous and intermittent (w/#24 NS) Intervention(s): Skin to skin;Hand expression Intervention(s): Alternate breast massage  Type of Nipple: Flat (everts after stimulation/flat if not) Intervention(s): Shells;Double electric pump  Comfort (Breast/Nipple): Filling, red/small blisters or bruises, mild/mod discomfort  Problem noted: Filling;Cracked, bleeding, blisters, bruises;Mild/Moderate discomfort Interventions (Filling): Double electric pump;Frequent nursing;Massage Interventions  (Cracked/bleeding/bruising/blister): Expressed breast milk to nipple Interventions (Mild/moderate discomfort): Hand massage;Hand expression;Post-pump;Comfort gels;Breast shields  Hold (Positioning): Assistance needed to correctly position infant at breast and maintain latch. Intervention(s): Breastfeeding basics reviewed;Support Pillows;Position options;Skin to skin  LATCH Score: 6  Lactation Tools Discussed/Used Tools: Shells;Nipple Shields;Pump;Comfort gels Nipple shield size: 24;20 Shell Type: Inverted Breast pump type: Double-Electric Breast Pump Pump Review: Setup, frequency, and cleaning;Milk Storage Initiated by:: Allayne Stack RN IBCLC Date initiated:: 07/07/16   Consult Status Consult Status: Follow-up Date: 07/07/16 Follow-up type: In-patient    Gaylia Kassel, Elta [place] 07/07/2016, 5:11 AM

## 2016-07-07 NOTE — Lactation Note (Signed)
This note was copied from a baby's chart. Lactation Consultation Note  Patient Name: Girl Daneya Fatemi [id number] Date: 07/07/2016 Reason for consult: Follow-up assessment  Baby 59 hours old. Mom reports that her nipples are sore, but she was able to nurse earlier. Offered to assist with latching the baby, but mom declined stating that FOB just fed baby 40 ml by bottle within the last hours. Enc mom to put baby to breast first, then supplement with EBM/formula, and then post-pump followed by hand expression. Mom reports that she thinks that she just wants to pump and bottle-feed EBM. Mom has DEBP at home. Enc mom to keep putting baby to breast for stimulation and discussed benefits of actually BF. Mom aware of OP/BFSG and [place] phone line assistance after D/C.   Maternal Data    Feeding Feeding Type: Bottle Fed - Formula Nipple Type: Slow - flow  LATCH Score/Interventions                      Lactation Tools Discussed/Used     Consult Status Consult Status: PRN    Andres Labrum 07/07/2016, 11:33 AM

## 2016-07-07 NOTE — Discharge Summary (Signed)
OB Discharge Summary     Patient Name: Cathy Benjamin DOB: Feb 28, 1991 MRN: [id number]  Date of admission: 07/05/2016 Delivering MD: Janyth Contes   Date of discharge: 07/07/2016  Admitting diagnosis: 39 WKS, CTXS Intrauterine pregnancy: [redacted]w[redacted]d     Secondary diagnosis:  Principal Problem:   VBAC, delivered, current hospitalization Active Problems:   Normal labor   SVD (spontaneous vaginal delivery)  Additional problems: none     Discharge diagnosis: Term Pregnancy Delivered                                                                                                Post partum procedures:none  Augmentation: none  Complications: None  Hospital course:  Onset of Labor With Vaginal Delivery     26 y.o. yo G4P1011 at [redacted]w[redacted]d was admitted in Active Labor on 07/05/2016. Patient had an uncomplicated labor course as follows:  Membrane Rupture Time/Date: 6:00 AM ,07/05/2016   Intrapartum Procedures: Episiotomy: None [1]                                         Lacerations:  Labial [10]  Patient had a delivery of a Viable infant. 07/05/2016  Information for the patient's newborn:  Zegarelli, Girl Shaneta [id number]       Pateint had an uncomplicated postpartum course.  She is ambulating, tolerating a regular diet, passing flatus, and urinating well. Patient is discharged home in stable condition on 07/07/16.   Physical exam  Vitals:   07/06/16 0422 07/06/16 0900 07/06/16 1730 07/07/16 0600  BP: 112/62 110/63 120/73 105/67  Pulse: 72 70 70 61  Resp: 18 16 18 18   Temp: 98.6 F (37 C) 98.2 F (36.8 C) 97.8 F (36.6 C) 97.5 F (36.4 C)  TempSrc: Oral Oral Oral Oral  SpO2: 100% 99%    Weight:      Height:       General: alert and cooperative Lochia: appropriate Uterine Fundus: firm  Labs: Lab Results  Component Value Date   WBC 13.6 (H) 07/06/2016   HGB 10.7 (L) 07/06/2016   HCT 31.9 (L) 07/06/2016   MCV 84.4 07/06/2016   PLT 217 07/06/2016   No flowsheet data  found.  Discharge instruction: per After Visit Summary and "Baby and Me Booklet".  After visit meds:  Allergies as of 07/07/2016   No Known Allergies     Medication List    TAKE these medications   ibuprofen 600 MG tablet Commonly known as:  ADVIL,MOTRIN Take 1 tablet (600 mg total) by mouth every 6 (six) hours.   prenatal multivitamin Tabs tablet Take 1 tablet by mouth daily at 12 noon.       Diet: routine diet  Activity: Advance as tolerated. Pelvic rest for 6 weeks.   Outpatient follow up:6 weeks Follow up Appt:No future appointments. Follow up Visit:No Follow-up on file.  Postpartum contraception: IUD Mirena  Newborn Data: Live born female  Birth Weight: 7 lb 1.4 oz (3215 g) APGAR: 9, 9  Baby  Feeding: Breast Disposition:home with mother   07/07/2016 Logan Bores, MD

## 2016-07-07 NOTE — Lactation Note (Signed)
This note was copied from a baby's chart. Lactation Consultation Note Baby 27 hrs of age lost 6%. Had 5 voids, 6 stools, 2 large emesis. BF well. LC to f/u. Patient Name: Cathy Benjamin [id number] Date: 07/07/2016 Reason for consult: Follow-up assessment;Infant weight loss   Maternal Data    Feeding Feeding Type: Breast Fed Length of feed: 15 min  LATCH Score/Interventions                      Lactation Tools Discussed/Used     Consult Status Consult Status: Follow-up Date: 07/07/16 Follow-up type: In-patient    Rielly Brunn, Elta [place] 07/07/2016, 2:18 AM

## 2016-07-07 NOTE — Progress Notes (Signed)
Post Partum Day 2 Subjective: no complaints and tolerating PO  Objective: Blood pressure 105/67, pulse 61, temperature 97.5 F (36.4 C), temperature source Oral, resp. rate 18, height 5\' 2"  (1.575 m), weight 77.1 kg (170 lb), SpO2 99 %, unknown if currently breastfeeding.  Physical Exam:  General: alert and cooperative Lochia: appropriate Uterine Fundus: firm   Recent Labs  07/05/16 1825 07/06/16 0614  HGB 12.4 10.7*  HCT 36.1 31.9*    Assessment/Plan: Discharge home   LOS: 2 days   Abdinasir Spadafore W 07/07/2016, 9:44 AM

## 2016-07-08 ENCOUNTER — Ambulatory Visit: Payer: Self-pay

## 2016-07-08 NOTE — Lactation Note (Signed)
This note was copied from a baby's chart. Lactation Consultation Note  Patient Name: Cathy Benjamin [id number] Date: 07/08/2016 Reason for consult: Follow-up assessment  Baby is 44 hours old . LC updated doc flow sheets per mom and baby just was fed 60 ml of formula. Per mom I haven't breast fed since yesterday due to being tired. And when I go home plan to just pump and bottle feed. Per mom has  DEBP [place]. LC reviewed supply and demand and the importance of consistent pumping around the clock at least 8 times a day , every 2-3 hours and not to go over 4 hours. LC discussed power pumping once a day over 60 mins. To enhance milk supply and to plan on pumping both breast together. Per mom hasn't pumped since yesterday. Mom denies any breast issues or soreness.  Mother informed of post-discharge support and given phone number to the lactation department, including services for phone call assistance; out-patient appointments; and breastfeeding support group. List of other breastfeeding resources in the community given in the handout. Encouraged mother to call for problems or concerns related to breastfeeding.   Maternal Data    Feeding Feeding Type: Bottle Fed - Formula Nipple Type: Slow - flow  LATCH Score/Interventions                Intervention(s): Breastfeeding basics reviewed     Lactation Tools Discussed/Used     Consult Status Consult Status: Complete Date: 07/08/16    Cathy Benjamin 07/08/2016, 10:09 AM

## 2017-05-25 DIAGNOSIS — D242 Benign neoplasm of left breast: Secondary | ICD-10-CM

## 2017-05-25 DIAGNOSIS — N6320 Unspecified lump in the left breast, unspecified quadrant: Secondary | ICD-10-CM

## 2017-05-25 HISTORY — DX: Benign neoplasm of left breast: D24.2

## 2017-05-25 HISTORY — DX: Unspecified lump in the left breast, unspecified quadrant: N63.20

## 2017-08-17 ENCOUNTER — Ambulatory Visit (INDEPENDENT_AMBULATORY_CARE_PROVIDER_SITE_OTHER): Payer: BC Managed Care – PPO | Admitting: Obstetrics and Gynecology

## 2017-08-17 ENCOUNTER — Ambulatory Visit: Payer: Self-pay | Admitting: Maternal Newborn

## 2017-08-17 ENCOUNTER — Ambulatory Visit: Payer: Self-pay | Admitting: Obstetrics and Gynecology

## 2017-08-17 ENCOUNTER — Encounter: Payer: Self-pay | Admitting: Obstetrics and Gynecology

## 2017-08-17 VITALS — BP 124/80 | Ht 61.0 in | Wt 146.0 lb

## 2017-08-17 DIAGNOSIS — N6320 Unspecified lump in the left breast, unspecified quadrant: Principal | ICD-10-CM

## 2017-08-17 DIAGNOSIS — N6342 Unspecified lump in left breast, subareolar: Secondary | ICD-10-CM

## 2017-08-17 DIAGNOSIS — N6325 Unspecified lump in the left breast, overlapping quadrants: Secondary | ICD-10-CM

## 2017-08-17 NOTE — Progress Notes (Signed)
Obstetrics & Gynecology Office Visit   Chief Complaint  Patient presents with  . Breast Pain    left nipple discharge    History of Present Illness: Breast Mass: Patient presents for evaluation of a breast mass. Change was noted 1 week ago. Patient does not routinely do self breast exams.  Patient has noted a change on breast exam. Breast cancer risk factors include: none.   Age of menarche was 58. 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 Patient has hormonal therapy. Patient is G3P2. Age of first live birth was 89. Patient did breast feed. Patient admits to nipple discharge. Patient denies to previous breast biopsy. Patient denies a personal history of breast cancer. About one week ago she noted left breast pain in the nipple area that was tender to touch around the nipple and rest of the breast.  She did a breast exam she noted a lump to the left of her nipple, it is about 2 cm in size. It has changed in size since then.  She noted nipple discharge yesterday that was thick and yellow. This was only noted on expression.  Nothing makes her pain better. Pressing on the area makes it worse. No associated symptoms.  She has never had this before.    Past Medical History:  Diagnosis Date  . Headache   . SVD (spontaneous vaginal delivery) 07/05/2016  . VBAC, delivered, current hospitalization 07/05/2016    Past Surgical History:  Procedure Laterality Date  . CESAREAN SECTION N/A 11/17/2014   Procedure: CESAREAN SECTION;  Surgeon: Honor Loh Ward, MD;  Location: [facility] ORS;  Service: Obstetrics;  Laterality: N/A;  . LASIK     Gynecologic History: No LMP recorded.  Obstetric History: [id number], G1 - SAB, G2, C-section, G3 - successful VBAC  Family History: both grandfathers lung cancer, father with testicular cancer, MGM with skin cancer.  Social History   Socioeconomic History  . Marital status: Single    Spouse name: Not on file  . Number of children: Not on file  .  Years of education: Not on file  . Highest education level: Not on file  Occupational History  . Not on file  Social Needs  . Financial resource strain: Not on file  . Food insecurity:    Worry: Not on file    Inability: Not on file  . Transportation needs:    Medical: Not on file    Non-medical: Not on file  Tobacco Use  . Smoking status: Former Research scientist (life sciences)  . Smokeless tobacco: Former Network engineer and Sexual Activity  . Alcohol use: Yes    Comment: socially  . Drug use: No  . Sexual activity: Yes  Lifestyle  . Physical activity:    Days per week: Not on file    Minutes per session: Not on file  . Stress: Not on file  Relationships  . Social connections:    Talks on phone: Not on file    Gets together: Not on file    Attends religious service: Not on file    Active member of club or organization: Not on file    Attends meetings of clubs or organizations: Not on file    Relationship status: Not on file  . Intimate partner violence:    Fear of current or ex partner: Not on file    Emotionally abused: Not on file    Physically abused: Not on file    Forced sexual activity: Not on  file  Other Topics Concern  . Not on file  Social History Narrative  . Not on file    No Known Allergies  Prior to Admission medications   Medication Sig Start Date End Date Taking? Authorizing Provider  levonorgestrel (MIRENA) 20 MCG/24HR IUD 1 each by Intrauterine route once.   Yes [provider]  Prenatal Vit-Fe Fumarate-FA (PRENATAL MULTIVITAMIN) TABS tablet Take 1 tablet by mouth daily at 12 noon.    [provider]    Review of Systems  Constitutional: Negative.   HENT: Negative.   Eyes: Negative.   Respiratory: Negative.   Cardiovascular: Negative.   Gastrointestinal: Negative.   Genitourinary: Negative.   Musculoskeletal: Negative.   Skin: Negative.        See HPI  Neurological: Negative.   Psychiatric/Behavioral: Negative.      Physical Exam BP  124/80   Ht 5\' 1"  (1.549 m)   Wt 146 lb (66.2 kg)   BMI 27.59 kg/m  No LMP recorded. Physical Exam  Constitutional: She is oriented to person, place, and time. She appears well-developed and well-nourished. No distress.  HENT:  Head: Normocephalic and atraumatic.  Eyes: Conjunctivae are normal. No scleral icterus.  Cardiovascular: Normal rate and regular rhythm.  Pulmonary/Chest: Effort normal and breath sounds normal. No respiratory distress. She has no wheezes. She has no rales. Right breast exhibits no inverted nipple, no mass, no nipple discharge, no skin change and no tenderness. Left breast exhibits mass. Left breast exhibits no inverted nipple, no nipple discharge, no skin change and no tenderness.    Abdominal: Soft. Bowel sounds are normal. She exhibits no distension and no mass. There is no tenderness. There is no rebound and no guarding.  Musculoskeletal: Normal range of motion. She exhibits no edema.  Neurological: She is alert and oriented to person, place, and time. No cranial nerve deficit.  Skin: Skin is warm and dry. No erythema.  Psychiatric: She has a normal mood and affect. Her behavior is normal. Judgment normal.    Female chaperone present for pelvic and breast  portions of the physical exam  Assessment: 27 y.o. G54P1011 female here for  1. Breast lump on left side at 3 o'clock position      Plan: Problem List Items Addressed This Visit    None    Visit Diagnoses    Breast lump on left side at 3 o'clock position    -  Primary   Relevant Orders   US BREAST LTD UNI LEFT INC AXILLA    New issue with workup planned.  Left breast ultrasound and follow up, as indicated by ultrasound.  Prentice Docker, MD 08/17/2017 12:06 PM

## 2017-08-20 ENCOUNTER — Ambulatory Visit
Admission: RE | Admit: 2017-08-20 | Discharge: 2017-08-20 | Disposition: A | Discharge status: Home / Self Care | Payer: BC Managed Care – PPO | Source: Ambulatory Visit | Attending: Obstetrics and Gynecology | Admitting: Obstetrics and Gynecology

## 2017-08-20 DIAGNOSIS — N6325 Unspecified lump in the left breast, overlapping quadrants: Secondary | ICD-10-CM

## 2017-08-20 DIAGNOSIS — N6324 Unspecified lump in the left breast, lower inner quadrant: Secondary | ICD-10-CM | POA: Diagnosis not present

## 2017-08-20 DIAGNOSIS — N6320 Unspecified lump in the left breast, unspecified quadrant: Secondary | ICD-10-CM | POA: Diagnosis present

## 2017-08-20 DIAGNOSIS — N6322 Unspecified lump in the left breast, upper inner quadrant: Secondary | ICD-10-CM | POA: Diagnosis not present

## 2017-10-05 ENCOUNTER — Other Ambulatory Visit: Payer: Self-pay | Admitting: Obstetrics and Gynecology

## 2017-10-05 DIAGNOSIS — N6320 Unspecified lump in the left breast, unspecified quadrant: Secondary | ICD-10-CM

## 2017-10-06 ENCOUNTER — Telehealth: Payer: Self-pay | Admitting: Obstetrics and Gynecology

## 2017-10-06 NOTE — Telephone Encounter (Signed)
Lmtrc

## 2017-10-06 NOTE — Telephone Encounter (Signed)
-----   Message from Will Bonnet, MD sent at 10/05/2017  9:59 PM EDT ----- Regarding: RE: Breast follow-up [place]. Done. Thank you!  ----- Message ----- From: [place] Lodge Sent: 10/05/2017   3:24 PM To: Will Bonnet, MD Subject: RE: Breast follow-up                           IMG [id number] Breast US to start 02/20/18. ----- Message ----- From: Will Bonnet, MD Sent: 10/05/2017   2:47 PM To: [place] Lodge Subject: Breast follow-up                               This patient recently had a breast ultrasound and they recommended follow up in 6 months. Can you tell me exactly what I need to order for her for 6 months from now so that she can have appropriate follow up? Thank you! Prentice Docker, MD

## 2017-10-07 ENCOUNTER — Other Ambulatory Visit: Payer: Self-pay | Admitting: Obstetrics and Gynecology

## 2017-10-07 DIAGNOSIS — N6320 Unspecified lump in the left breast, unspecified quadrant: Secondary | ICD-10-CM

## 2017-10-07 DIAGNOSIS — R928 Other abnormal and inconclusive findings on diagnostic imaging of breast: Secondary | ICD-10-CM

## 2018-02-21 ENCOUNTER — Other Ambulatory Visit: Payer: BC Managed Care – PPO

## 2018-04-26 ENCOUNTER — Encounter: Payer: Self-pay | Admitting: Obstetrics and Gynecology

## 2018-12-16 ENCOUNTER — Other Ambulatory Visit: Payer: Self-pay

## 2018-12-16 DIAGNOSIS — Z20822 Contact with and (suspected) exposure to covid-19: Secondary | ICD-10-CM

## 2018-12-19 LAB — NOVEL CORONAVIRUS, NAA: SARS-CoV-2, NAA: NOT DETECTED

## 2019-04-12 ENCOUNTER — Telehealth: Payer: Self-pay | Admitting: Obstetrics and Gynecology

## 2019-04-12 NOTE — Telephone Encounter (Signed)
Contacted the patient to f/u on her missed appointment at [facility] last fall, which has still not been rescheduled (MyChart message and letter from Dr. Glennon Mac were sent last year). [facility]'s phone # was given, and the patient said she would give them a call.

## 2019-04-22 ENCOUNTER — Emergency Department: Payer: No Typology Code available for payment source

## 2019-04-22 ENCOUNTER — Emergency Department
Admission: EM | Admit: 2019-04-22 | Discharge: 2019-04-22 | Disposition: A | Discharge status: Home / Self Care | Payer: No Typology Code available for payment source | Attending: Emergency Medicine | Admitting: Emergency Medicine

## 2019-04-22 ENCOUNTER — Other Ambulatory Visit: Payer: Self-pay

## 2019-04-22 DIAGNOSIS — Z79899 Other long term (current) drug therapy: Secondary | ICD-10-CM | POA: Insufficient documentation

## 2019-04-22 DIAGNOSIS — R0789 Other chest pain: Secondary | ICD-10-CM | POA: Insufficient documentation

## 2019-04-22 DIAGNOSIS — Z87891 Personal history of nicotine dependence: Secondary | ICD-10-CM | POA: Insufficient documentation

## 2019-04-22 DIAGNOSIS — M542 Cervicalgia: Secondary | ICD-10-CM | POA: Diagnosis not present

## 2019-04-22 DIAGNOSIS — Z975 Presence of (intrauterine) contraceptive device: Secondary | ICD-10-CM | POA: Insufficient documentation

## 2019-04-22 DIAGNOSIS — R079 Chest pain, unspecified: Secondary | ICD-10-CM

## 2019-04-22 LAB — CBC
HCT: 43.7 % (ref 36.0–46.0)
Hemoglobin: 15.3 g/dL — ABNORMAL HIGH (ref 12.0–15.0)
MCH: 29.9 pg (ref 26.0–34.0)
MCHC: 35 g/dL (ref 30.0–36.0)
MCV: 85.4 fL (ref 80.0–100.0)
Platelets: 292 K/uL (ref 150–400)
RBC: 5.12 MIL/uL — ABNORMAL HIGH (ref 3.87–5.11)
RDW: 12.3 % (ref 11.5–15.5)
WBC: 9.2 K/uL (ref 4.0–10.5)
nRBC: 0 % (ref 0.0–0.2)

## 2019-04-22 LAB — BASIC METABOLIC PANEL
Anion gap: 11 (ref 5–15)
BUN: 8 mg/dL (ref 6–20)
CO2: 25 mmol/L (ref 22–32)
Calcium: 9.7 mg/dL (ref 8.9–10.3)
Chloride: 103 mmol/L (ref 98–111)
Creatinine, Ser: 0.61 mg/dL (ref 0.44–1.00)
GFR calc Af Amer: >60 mL/min (ref >60)
GFR calc non Af Amer: >60 mL/min (ref >60)
Glucose, Bld: 102 mg/dL — ABNORMAL HIGH (ref 70–99)
Potassium: 3.1 mmol/L — ABNORMAL LOW (ref 3.5–5.1)
Sodium: 139 mmol/L (ref 135–145)

## 2019-04-22 LAB — TROPONIN I (HIGH SENSITIVITY)
Troponin I (High Sensitivity): 3 ng/L (ref <18)
Troponin I (High Sensitivity): 3 ng/L

## 2019-04-22 LAB — POCT PREGNANCY, URINE: Preg Test, Ur: NEGATIVE

## 2019-04-22 MED ORDER — IOHEXOL 350 MG/ML SOLN
100.0000 mL | Freq: Once | INTRAVENOUS | Status: AC | PRN
  Administered 2019-04-22: 06:00:00 100 mL via INTRAVENOUS

## 2019-04-22 MED ORDER — SODIUM CHLORIDE 0.9% FLUSH: 3.0000 mL | Freq: Once | INTRAVENOUS | Status: DC

## 2019-04-22 NOTE — Discharge Instructions (Signed)

## 2019-04-22 NOTE — ED Provider Notes (Signed)
8:20 AM Assumed care for off going team.   Blood pressure (!) 142/91, pulse 83, temperature 98.3 F (36.8 C), resp. rate 15, height 5\' 1"  (1.549 m), weight 68 kg, SpO2 100 %, unknown if currently breastfeeding.  See their HPI for full report but in brief Repeat trop then d/c. Stabbing chest pain- CT negative.   Re-evaluated pt and updated on results. She is comfortable with plan.   I discussed the provisional nature of ED diagnosis, the treatment so far, the ongoing plan of care, follow up appointments and return precautions with the patient and any family or support people present. They expressed understanding and agreed with the plan, discharged home.           Vanessa Duncan, MD 04/22/19 [phone]

## 2019-04-22 NOTE — ED Provider Notes (Signed)
[facility] Emergency Department Provider Note  ____________________________________________  Time seen: Approximately 5:24 AM  I have reviewed the triage vital signs and the nursing notes.   HISTORY  Chief Complaint Chest Pain   HPI Cathy Benjamin is a 28 y.o. female with no significant past medical history who presents for evaluation of chest and neck pain.  Patient reports that she was sleeping when the pain woke her up from her sleep at 1 AM.  She describes the pain as sharp located in the center of her chest, intermittent.  When the pain is on it is associated with shortness of breath.  Patient initially thought it was due to reflux.  However as the night progressed patient started having numbness and dull pain on the right side of her neck and her jaw.  She reports that her neck is tender to the touch on the right side.  She denies any trauma.  She denies slurred speech, facial droop, changes in vision, unilateral weakness or numbness, or headache.  She is on Mirena IUD.  She denies personal history of blood clots, recent travel immobilization, leg pain or swelling, or hemoptysis.  Her grandmother has had a blood clot.  She denies smoking, alcohol or drugs.  She denies family history of heart attacks or strokes.  She has no chest pain at this time just neck pain.   Past Medical History:  Diagnosis Date  . Headache   . SVD (spontaneous vaginal delivery) 07/05/2016  . VBAC, delivered, current hospitalization 07/05/2016    Patient Active Problem List   Diagnosis Date Noted  . Left breast mass 10/05/2017  . Normal labor 07/05/2016  . SVD (spontaneous vaginal delivery) 07/05/2016  . VBAC, delivered, current hospitalization 07/05/2016  . Fetal intolerance to labor, delivered, current hospitalization 11/20/2014  . S/P cesarean section 11/20/2014  . Pregnancy 11/16/2014  . Decreased fetal movement 11/04/2014    Past Surgical History:  Procedure Laterality  Date  . CESAREAN SECTION N/A 11/17/2014   Procedure: CESAREAN SECTION;  Surgeon: Honor Loh Ward, MD;  Location: [facility] ORS;  Service: Obstetrics;  Laterality: N/A;  . LASIK      Prior to Admission medications   Medication Sig Start Date End Date Taking? Authorizing Provider  levonorgestrel (MIRENA) 20 MCG/24HR IUD 1 each by Intrauterine route once.    [provider]  Prenatal Vit-Fe Fumarate-FA (PRENATAL MULTIVITAMIN) TABS tablet Take 1 tablet by mouth daily at 12 noon.    [provider]    Allergies Patient has no known allergies.  No family history on file.  Social History Social History   Tobacco Use  . Smoking status: Former Research scientist (life sciences)  . Smokeless tobacco: Former Network engineer Use Topics  . Alcohol use: Yes    Comment: socially  . Drug use: No    Review of Systems  Constitutional: Negative for fever. Eyes: Negative for visual changes. ENT: Negative for sore throat. + neck pain Neck: No neck pain  Cardiovascular: + chest pain. Respiratory: +shortness of breath. Gastrointestinal: Negative for abdominal pain, vomiting or diarrhea. Genitourinary: Negative for dysuria. Musculoskeletal: Negative for back pain. Skin: Negative for rash. Neurological: Negative for headaches, weakness or numbness. Psych: No SI or HI  ____________________________________________   PHYSICAL EXAM:  VITAL SIGNS: ED Triage Vitals [04/22/19 0252]  Enc Vitals Group     BP (!) 148/100     Pulse Rate 93     Resp 17     Temp 98.3 F (36.8 C)  Temp src      SpO2 100 %     Weight 150 lb (68 kg)     Height 5\' 1"  (1.549 m)     Head Circumference      Peak Flow      Pain Score 7     Pain Loc      Pain Edu?      Excl. in [place]?     Constitutional: Alert and oriented. Well appearing and in no apparent distress. HEENT:      Head: Normocephalic and atraumatic.         Eyes: Conjunctivae are normal. Sclera is non-icteric.       Mouth/Throat: Mucous membranes are moist.        Neck: Supple with no signs of meningismus. Palpation of the R carotid induces pain, no bruit Cardiovascular: Regular rate and rhythm. No murmurs, gallops, or rubs. 2+ symmetrical distal pulses are present in all extremities. No JVD. Respiratory: Normal respiratory effort. Lungs are clear to auscultation bilaterally. No wheezes, crackles, or rhonchi.  Gastrointestinal: Soft, non tender, and non distended with positive bowel sounds. No rebound or guarding. Musculoskeletal: Nontender with normal range of motion in all extremities. No edema, cyanosis, or erythema of extremities. Neurologic: Normal speech and language. Face is symmetric.  Intact strength and sensation x4, intact sensation of the face, no pronator drift or dysmetria, extraocular movements are intact, pupils are equal round and reactive. Skin: Skin is warm, dry and intact. No rash noted. Psychiatric: Mood and affect are normal. Speech and behavior are normal.  ____________________________________________   LABS (all labs ordered are listed, but only abnormal results are displayed)  Labs Reviewed  BASIC METABOLIC PANEL - Abnormal; Notable for the following components:      Result Value   Potassium 3.1 (*)    Glucose, Bld 102 (*)    All other components within normal limits  CBC - Abnormal; Notable for the following components:   RBC 5.12 (*)    Hemoglobin 15.3 (*)    All other components within normal limits  POCT PREGNANCY, URINE  TROPONIN I (HIGH SENSITIVITY)  TROPONIN I (HIGH SENSITIVITY)   ____________________________________________  EKG  ED ECG REPORT I, Rudene Re, the attending physician, personally viewed and interpreted this ECG.  Normal sinus rhythm, rate of 96, normal intervals, normal axis, no ST elevations or depressions. ____________________________________________  RADIOLOGY  I have personally reviewed the images performed during this visit and I agree with the Radiologist's read.    Interpretation by Radiologist:  Dg Chest 2 View  Result Date: 04/22/2019 CLINICAL DATA:  Chest pain EXAM: CHEST - 2 VIEW COMPARISON:  None. FINDINGS: The heart size and mediastinal contours are within normal limits. Both lungs are clear. The visualized skeletal structures are unremarkable. IMPRESSION: No active cardiopulmonary disease. Electronically Signed   By: Ulyses Jarred M.D.   On: 04/22/2019 06:04   Ct Angio Neck W And/or Wo Contrast  Result Date: 04/22/2019 CLINICAL DATA:  Chest pain radiating to the back and jaw. Right-sided neck pain. Right facial numbness. EXAM: CT ANGIOGRAPHY NECK TECHNIQUE: Multidetector CT imaging of the neck was performed using the standard protocol during bolus administration of intravenous contrast. Multiplanar CT image reconstructions and MIPs were obtained to evaluate the vascular anatomy. Carotid stenosis measurements (when applicable) are obtained utilizing NASCET criteria, using the distal internal carotid diameter as the denominator. CONTRAST:  165mL OMNIPAQUE IOHEXOL 350 MG/ML SOLN COMPARISON:  None. FINDINGS: Aortic arch: Standard 3 vessel aortic arch with  widely patent arch vessel origins. Right carotid system: Patent and smooth without evidence of stenosis or dissection. Left carotid system: Patent and smooth without evidence of stenosis or dissection. Vertebral arteries: Patent and smooth without evidence of stenosis or dissection. Moderately to strongly dominant right vertebral artery. Skeleton: No acute osseous abnormality or suspicious osseous lesion. Other neck: No evidence of cervical lymphadenopathy or mass. Upper chest: Clear lung apices. IMPRESSION: Negative neck CTA. Electronically Signed   By: Logan Bores M.D.   On: 04/22/2019 06:37   Ct Angio Chest Aorta W/cm & Or Wo/cm  Result Date: 04/22/2019 CLINICAL DATA:  Chest pain radiating to the back and jaw. Right-sided neck pain. Right facial numbness. EXAM: CT ANGIOGRAPHY CHEST WITH CONTRAST  TECHNIQUE: Multidetector CT imaging of the chest was performed using the standard protocol during bolus administration of intravenous contrast. Multiplanar CT image reconstructions and MIPs were obtained to evaluate the vascular anatomy. CONTRAST:  197mL OMNIPAQUE IOHEXOL 350 MG/ML SOLN COMPARISON:  Chest radiographs 04/22/2019 FINDINGS: Cardiovascular: No thoracic aortic intramural hematoma is evident on noncontrast images. There is no evidence of thoracic aortic dissection or aneurysm. Pulmonary artery patency is not evaluated due to contrast timing. The heart is normal in size. There is no pericardial effusion. Mediastinum/Nodes: No enlarged axillary, mediastinal, or hilar lymph nodes. Unremarkable esophagus and included thyroid. Lungs/Pleura: No pleural effusion or pneumothorax. Clear lungs. Upper Abdomen: Unremarkable. Musculoskeletal: No acute osseous abnormality or suspicious osseous lesion. Review of the MIP images confirms the above findings. IMPRESSION: Negative chest CTA. Electronically Signed   By: Logan Bores M.D.   On: 04/22/2019 06:42      ____________________________________________   PROCEDURES  Procedure(s) performed: None Procedures Critical Care performed:  None ____________________________________________   INITIAL IMPRESSION / ASSESSMENT AND PLAN / ED COURSE   28 y.o. female with no significant past medical history who presents for evaluation of intermittent sharp chest pain associated with SOB and dull R sided jaw and neck pain since 1 AM.  Patient is well-appearing in no distress, palpation of the right carotid induces pain, there is no bruits, she is neurologically intact.  EKG showing no ischemic changes.  Does show S1Q3T3 although PE is less likely since pain has been intermittent and patient has no tachypnea, tachycardia, or hypoxia.  Will do a CT angiogram to rule out cervical artery dissection. CXR WNL with no signs of pulmonary edema, pneumothorax or pneumonia.  Labs  with no significant electrolyte abnormalities, no leukocytosis, no anemia.  Troponin is negative will get repeat to further stratify although history inconsistent with ACS.    _________________________ 7:07 AM on 04/22/2019 -----------------------------------------  CT neck and chest with no acute findings.  Repeat troponin is pending.  Patient remains well-appearing with no further episodes of chest pain.  Plan to discharge home after second troponin is back if negative.  Discussed my standard return precautions and follow-up with PCP.    As part of my medical decision making, I reviewed the following data within the [place] notes reviewed and incorporated, Labs reviewed , EKG interpreted , Old chart reviewed, Radiograph reviewed , Notes from prior ED visits and [place] Controlled Substance Database   Please note:  Patient was evaluated in Emergency Department today for the symptoms described in the history of present illness. Patient was evaluated in the context of the global COVID-19 pandemic, which necessitated consideration that the patient might be at risk for infection with the SARS-CoV-2 virus that causes COVID-19. Institutional protocols and algorithms  that pertain to the evaluation of patients at risk for COVID-19 are in a state of rapid change based on information released by regulatory bodies including the CDC and federal and state organizations. These policies and algorithms were followed during the patient's care in the ED.  Some ED evaluations and interventions may be delayed as a result of limited staffing during the pandemic.   ____________________________________________   FINAL CLINICAL IMPRESSION(S) / ED DIAGNOSES   Final diagnoses:  Chest pain, unspecified type  Neck pain      NEW MEDICATIONS STARTED DURING THIS VISIT:  ED Discharge Orders    None       Note:  This document was prepared using Dragon voice recognition software and may  include unintentional dictation errors.    Alfred Levins, [place], MD 04/22/19 [phone]

## 2019-04-22 NOTE — ED Notes (Signed)
Patient returned from CT

## 2019-04-22 NOTE — ED Triage Notes (Signed)
Patient c/o chest pain radiating to  Back and jaw.

## 2019-07-14 ENCOUNTER — Ambulatory Visit (INDEPENDENT_AMBULATORY_CARE_PROVIDER_SITE_OTHER): Payer: 59 | Admitting: Certified Nurse Midwife

## 2019-07-14 ENCOUNTER — Other Ambulatory Visit: Payer: Self-pay

## 2019-07-14 ENCOUNTER — Encounter: Payer: Self-pay | Admitting: Certified Nurse Midwife

## 2019-07-14 VITALS — BP 120/80 | HR 72 | Temp 94.9°F | Ht 61.0 in | Wt 150.0 lb

## 2019-07-14 DIAGNOSIS — N6011 Diffuse cystic mastopathy of right breast: Secondary | ICD-10-CM | POA: Diagnosis not present

## 2019-07-14 DIAGNOSIS — N6012 Diffuse cystic mastopathy of left breast: Secondary | ICD-10-CM

## 2019-07-14 NOTE — Progress Notes (Signed)
Obstetrics & Gynecology Office Visit   Chief Complaint:  Chief Complaint  Patient presents with  . Breast Problem    found lump right breast c pain under arm    History of Present Illness: 29 year old G3 P2012 WF who presents with complaints of right axillary tenderness for a few months and this morning found a "knot" in the UOQ of the right breast.  Currently is amenorrheic on the Mirena IUD which was inserted 6 weeks postpartum after her 2018 delivery. She states that her baby at times would press un her breast/axillary area when she would be holding her, but denies any other trauma. Drinks a lot of [facility] and has been trying to cut down her caffeine in the last week. Had a ultrasound on her left breast in 2019 for a left breast mass at 3 o'clock. 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 She still has a mass in this area. She is not breast feeding. No fever    Review of Systems: as stated in the HPI and as below Review of Systems  Constitutional: Negative for chills, fever and weight loss.  HENT: Negative for congestion, sinus pain and sore throat.   Eyes: Negative for blurred vision and pain.  Respiratory: Negative for hemoptysis, shortness of breath and wheezing.   Cardiovascular: Negative for chest pain, palpitations and leg swelling.  Gastrointestinal: Negative for abdominal pain, blood in stool, diarrhea, heartburn, nausea and vomiting.  Genitourinary: Negative for dysuria, frequency, hematuria and urgency.  Musculoskeletal: Negative for back pain, joint pain and myalgias. Falls: possibly.  Skin: Negative for itching and rash.  Neurological: Positive for headaches. Negative for dizziness and tingling.  Endo/Heme/Allergies: Negative for environmental allergies and polydipsia. Does not bruise/bleed easily.       Negative for hirsutism     Psychiatric/Behavioral: Negative for depression. The patient is nervous/anxious. The patient does not have insomnia.      Past Medical History:  Past Medical History:  Diagnosis Date  . Headache   . Left breast lump 2019   caused by caffeine  . SVD (spontaneous vaginal delivery) 07/05/2016  . VBAC, delivered, current hospitalization 07/05/2016    Past Surgical History:  Past Surgical History:  Procedure Laterality Date  . CESAREAN SECTION N/A 11/17/2014   Procedure: CESAREAN SECTION;  Surgeon: Honor Loh Ward, MD;  Location: [facility] ORS;  Service: Obstetrics;  Laterality: N/A;  . LASIK      Gynecologic History: No LMP recorded (lmp unknown).  Obstetric History: [id number]  Family History:  Positive for DM and lung cancer  Social History:  Social History   Socioeconomic History  . Marital status: Single    Spouse name: Not on file  . Number of children: 2  . Years of education: Not on file  . Highest education level: Not on file  Occupational History  . Not on file  Tobacco Use  . Smoking status: Former Research scientist (life sciences)  . Smokeless tobacco: Former Network engineer and Sexual Activity  . Alcohol use: Yes    Comment: socially  . Drug use: No  . Sexual activity: Yes    Birth control/protection: I.U.D.  Other Topics Concern  . Not on file  Social History Narrative  . Not on file   Social Determinants of Health   Financial Resource Strain:   . Difficulty of Paying Living  Expenses: Not on file  Food Insecurity:   . Worried About Charity fundraiser in the Last Year: Not on file  . Ran Out of Food in the Last Year: Not on file  Transportation Needs:   . Lack of Transportation (Medical): Not on file  . Lack of Transportation (Non-Medical): Not on file  Physical Activity:   . Days of Exercise per Week: Not on file  . Minutes of Exercise per Session: Not on file  Stress:   . Feeling of Stress : Not on file  Social Connections:   . Frequency of Communication with Friends and  Family: Not on file  . Frequency of Social Gatherings with Friends and Family: Not on file  . Attends Religious Services: Not on file  . Active Member of Clubs or Organizations: Not on file  . Attends Archivist Meetings: Not on file  . Marital Status: Not on file  Intimate Partner Violence:   . Fear of Current or Ex-Partner: Not on file  . Emotionally Abused: Not on file  . Physically Abused: Not on file  . Sexually Abused: Not on file    Allergies:  No Known Allergies  Medications: Prior to Admission medications   Medication Sig Start Date End Date Taking? Authorizing Provider  levonorgestrel (MIRENA) 20 MCG/24HR IUD 1 each by Intrauterine route once.   Yes [provider]  Prenatal Vit-Fe Fumarate-FA (PRENATAL MULTIVITAMIN) TABS tablet Take 1 tablet by mouth daily at 12 noon.    [provider]    Physical Exam Vitals: BP 120/80   Pulse 72   Temp (!) 94.9 F (34.9 C)   Ht 5\' 1"  (1.549 m)   Wt 150 lb (68 kg)   LMP  (LMP Unknown)   BMI 28.34 kg/m  Physical Exam  Constitutional: She is oriented to person, place, and time. She appears well-developed and well-nourished. No distress.  Respiratory: Effort normal.    Right breast: mild erythema in axillary area, mildly tender lumpiness in upper breast and unable to feel dominant mass. Left breast: 1.5cm round mobile mass at 3 o'clock, NT. No skin retraction, or nipple retraction or axillary, supraclavicular or infraclavicular LAN bilaterally,   Neurological: She is alert and oriented to person, place, and time.  Skin: Skin is warm and dry.  Psychiatric: She has a normal mood and affect.     Assessment: 29 y.o. 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  Encouraged to continue to decrease caffeine in diet. RTO  prn  Dalia Heading, CNM

## 2019-09-22 NOTE — Telephone Encounter (Signed)
Would you mind following up with this patient?  It has been five months since we tried to contact her.  I can send another letter, if you can't contact her.  Thank you!

## 2019-09-22 NOTE — Telephone Encounter (Signed)
OK. Thanks. I can take this out of my reminder list, then.

## 2020-02-23 DIAGNOSIS — A6000 Herpesviral infection of urogenital system, unspecified: Secondary | ICD-10-CM

## 2020-02-23 HISTORY — DX: Herpesviral infection of urogenital system, unspecified: A60.00

## 2020-03-08 ENCOUNTER — Other Ambulatory Visit (HOSPITAL_COMMUNITY)
Admission: RE | Admit: 2020-03-08 | Discharge: 2020-03-08 | Disposition: A | Discharge status: Home / Self Care | Payer: 59 | Source: Ambulatory Visit | Attending: Advanced Practice Midwife | Admitting: Advanced Practice Midwife

## 2020-03-08 ENCOUNTER — Other Ambulatory Visit: Payer: Self-pay

## 2020-03-08 ENCOUNTER — Encounter: Payer: Self-pay | Admitting: Advanced Practice Midwife

## 2020-03-08 ENCOUNTER — Ambulatory Visit (INDEPENDENT_AMBULATORY_CARE_PROVIDER_SITE_OTHER): Payer: 59 | Admitting: Advanced Practice Midwife

## 2020-03-08 VITALS — BP 120/80 | Ht 61.0 in | Wt 140.0 lb

## 2020-03-08 DIAGNOSIS — N760 Acute vaginitis: Secondary | ICD-10-CM | POA: Diagnosis not present

## 2020-03-08 DIAGNOSIS — B9689 Other specified bacterial agents as the cause of diseases classified elsewhere: Secondary | ICD-10-CM | POA: Diagnosis not present

## 2020-03-08 DIAGNOSIS — N898 Other specified noninflammatory disorders of vagina: Secondary | ICD-10-CM | POA: Insufficient documentation

## 2020-03-08 MED ORDER — FLUCONAZOLE 150 MG PO TABS: 150.0000 mg | ORAL_TABLET | Freq: Once | ORAL | 1 refills | Status: AC

## 2020-03-08 MED ORDER — FLUCONAZOLE 150 MG PO TABS: 150.0000 mg | ORAL_TABLET | Freq: Once | ORAL | 1 refills | Status: DC

## 2020-03-08 MED ORDER — METRONIDAZOLE 0.75 % VA GEL: 1.0000 | Freq: Every day | VAGINAL | 1 refills | Status: AC

## 2020-03-08 MED ORDER — METRONIDAZOLE 0.75 % VA GEL: 1.0000 | Freq: Every day | VAGINAL | 1 refills | Status: DC

## 2020-03-11 ENCOUNTER — Encounter: Payer: Self-pay | Admitting: Advanced Practice Midwife

## 2020-03-11 LAB — CERVICOVAGINAL ANCILLARY ONLY
Bacterial Vaginitis (gardnerella): NEGATIVE
Candida Glabrata: NEGATIVE
Candida Vaginitis: POSITIVE — AB
Chlamydia: NEGATIVE
Comment: NEGATIVE
Comment: NEGATIVE
Comment: NEGATIVE
Comment: NEGATIVE
Comment: NEGATIVE
Comment: NORMAL
Neisseria Gonorrhea: NEGATIVE
Trichomonas: NEGATIVE

## 2020-03-11 NOTE — Progress Notes (Signed)
Patient ID: Cathy Benjamin, female   DOB: December 25, 1990, 29 y.o.   MRN: [id number]  Reason for Consult: Vaginitis   Subjective:  Date of Service: 03/08/2020  HPI:  Cathy Benjamin is a 29 y.o. female being seen for acute vaginitis symptoms which began in the past few days. She has yellow vaginal discharge with irritation, itching and some burning. She mentions a non-fishy odor. She has recently had intercourse with her estranged husband and is concerned for the possibility of STDs. She has a history of yeast. She used 1 dose of Monistat last night without relief.   Past Medical History:  Diagnosis Date  . Headache   . Left breast lump 2019   caused by caffeine  . SVD (spontaneous vaginal delivery) 07/05/2016  . VBAC, delivered, current hospitalization 07/05/2016   History reviewed. No pertinent family history. Past Surgical History:  Procedure Laterality Date  . CESAREAN SECTION N/A 11/17/2014   Procedure: CESAREAN SECTION;  Surgeon: Honor Loh Ward, MD;  Location: [facility] ORS;  Service: Obstetrics;  Laterality: N/A;  . LASIK      Short Social History:  Social History   Tobacco Use  . Smoking status: Former Research scientist (life sciences)  . Smokeless tobacco: Former Network engineer Use Topics  . Alcohol use: Yes    Comment: socially    No Known Allergies  Current Outpatient Medications  Medication Sig Dispense Refill  . levonorgestrel (MIRENA) 20 MCG/24HR IUD 1 each by Intrauterine route once.    . metroNIDAZOLE (METROGEL) 0.75 % vaginal gel Place 1 Applicatorful vaginally at bedtime for 5 days. 70 g 1  . Prenatal Vit-Fe Fumarate-FA (PRENATAL MULTIVITAMIN) TABS tablet Take 1 tablet by mouth daily at 12 noon. (Patient not taking: Reported on 03/08/2020)     No current facility-administered medications for this visit.    Review of Systems  Constitutional: Negative for chills and fever.  HENT: Negative for congestion, ear discharge, ear pain, hearing loss, sinus pain and sore throat.   Eyes:  Negative for blurred vision and double vision.  Respiratory: Negative for cough, shortness of breath and wheezing.   Cardiovascular: Negative for chest pain, palpitations and leg swelling.  Gastrointestinal: Negative for abdominal pain, blood in stool, constipation, diarrhea, heartburn, melena, nausea and vomiting.  Genitourinary: Negative for dysuria, flank pain, frequency, hematuria and urgency.       Positive for vaginal discharge, irritation, itching, odor  Musculoskeletal: Negative for back pain, joint pain and myalgias.  Skin: Negative for itching and rash.  Neurological: Negative for dizziness, tingling, tremors, sensory change, speech change, focal weakness, seizures, loss of consciousness, weakness and headaches.  Endo/Heme/Allergies: Negative for environmental allergies. Does not bruise/bleed easily.  Psychiatric/Behavioral: Negative for depression, hallucinations, memory loss, substance abuse and suicidal ideas. The patient is not nervous/anxious and does not have insomnia.         Objective:  Objective   Vitals:   03/08/20 1050  BP: 120/80  Weight: 140 lb (63.5 kg)  Height: 5\' 1"  (1.549 m)   Body mass index is 26.45 kg/m. Constitutional: Well nourished, well developed female in no acute distress.  HEENT: normal Skin: Warm and dry.  Respiratory: Clear to auscultation bilateral. Normal respiratory effort Back: no CVAT Neuro: DTRs 2+, Cranial nerves grossly intact Psych: Alert and Oriented x3. No memory deficits. Normal mood and affect.  MS: normal gait, normal bilateral lower extremity ROM/strength/stability.  Pelvic exam:  is not limited by body habitus EGBUS: within normal limits Vagina: within normal limits and with inflamed mucosa,  thick white discharge Cervix: normal appearance  Data: Wet Prep: positive for clue cells, negative for yeast  Assessment/Plan:  29 y.o. G3 P2012 with likely Bacterial Vaginosis   Aptima: vaginitis/STD Rx Metronidazole gel Rx  Diflucan Return to clinic as needed   [facility] 03/11/2020, 10:44 AM

## 2020-03-12 ENCOUNTER — Other Ambulatory Visit: Payer: Self-pay | Admitting: Obstetrics & Gynecology

## 2020-03-12 ENCOUNTER — Telehealth: Payer: Self-pay

## 2020-03-12 ENCOUNTER — Other Ambulatory Visit: Payer: Self-pay | Admitting: Obstetrics

## 2020-03-12 MED ORDER — FLUCONAZOLE 150 MG PO TABS: 150.0000 mg | ORAL_TABLET | Freq: Once | ORAL | 3 refills | Status: AC

## 2020-03-12 NOTE — Telephone Encounter (Signed)
Pt called triage needing a diflucan sent in, she had sent Opal Sidles a message but Opal Sidles is not in the office , Results from 10/15 are positive for yeast.

## 2020-03-14 ENCOUNTER — Other Ambulatory Visit: Payer: Self-pay

## 2020-03-14 ENCOUNTER — Encounter: Payer: Self-pay | Admitting: Advanced Practice Midwife

## 2020-03-14 ENCOUNTER — Ambulatory Visit (INDEPENDENT_AMBULATORY_CARE_PROVIDER_SITE_OTHER): Payer: 59 | Admitting: Advanced Practice Midwife

## 2020-03-14 ENCOUNTER — Other Ambulatory Visit (HOSPITAL_COMMUNITY)
Admission: RE | Admit: 2020-03-14 | Discharge: 2020-03-14 | Disposition: A | Discharge status: Home / Self Care | Payer: 59 | Source: Ambulatory Visit | Attending: Advanced Practice Midwife | Admitting: Advanced Practice Midwife

## 2020-03-14 VITALS — BP 115/79 | HR 105 | Ht 61.0 in | Wt 137.0 lb

## 2020-03-14 DIAGNOSIS — N898 Other specified noninflammatory disorders of vagina: Secondary | ICD-10-CM

## 2020-03-14 DIAGNOSIS — R300 Dysuria: Secondary | ICD-10-CM

## 2020-03-14 DIAGNOSIS — Z113 Encounter for screening for infections with a predominantly sexual mode of transmission: Secondary | ICD-10-CM | POA: Diagnosis not present

## 2020-03-14 DIAGNOSIS — R102 Pelvic and perineal pain: Secondary | ICD-10-CM | POA: Insufficient documentation

## 2020-03-14 LAB — POCT URINALYSIS DIPSTICK
Glucose, UA: NEGATIVE
Nitrite, UA: NEGATIVE
Protein, UA: POSITIVE — AB
Spec Grav, UA: 1.025 (ref 1.010–1.025)
Urobilinogen, UA: 4 E.U./dL — AB
pH, UA: 7 (ref 5.0–8.0)

## 2020-03-14 MED ORDER — FLUCONAZOLE 150 MG PO TABS: 150.0000 mg | ORAL_TABLET | Freq: Once | ORAL | 3 refills | Status: AC

## 2020-03-14 MED ORDER — CEPHALEXIN 500 MG PO CAPS: 500.0000 mg | ORAL_CAPSULE | Freq: Four times a day (QID) | ORAL | 0 refills | Status: DC

## 2020-03-14 MED ORDER — LIDOCAINE HCL URETHRAL/MUCOSAL 2 % EX GEL: 1.0000 "application " | CUTANEOUS | 1 refills | Status: DC | PRN

## 2020-03-14 NOTE — Patient Instructions (Signed)
Vaginitis Vaginitis is a condition in which the vaginal tissue swells and becomes red (inflamed). This condition is most often caused by a change in the normal balance of bacteria and yeast that live in the vagina. This change causes an overgrowth of certain bacteria or yeast, which causes the inflammation. There are different types of vaginitis, but the most common types are:  Bacterial vaginosis.  Yeast infection (candidiasis).  Trichomoniasis vaginitis. This is a sexually transmitted disease (STD).  Viral vaginitis.  Atrophic vaginitis.  Allergic vaginitis. What are the causes? The cause of this condition depends on the type of vaginitis. It can be caused by:  Bacteria (bacterial vaginosis).  Yeast, which is a fungus (yeast infection).  A parasite (trichomoniasis vaginitis).  A virus (viral vaginitis).  Low hormone levels (atrophic vaginitis). Low hormone levels can occur during pregnancy, breastfeeding, or after menopause.  Irritants, such as bubble baths, scented tampons, and feminine sprays (allergic vaginitis). Other factors can change the normal balance of the yeast and bacteria that live in the vagina. These include:  Antibiotic medicines.  Poor hygiene.  Diaphragms, vaginal sponges, spermicides, birth control pills, and intrauterine devices (IUD).  Sex.  Infection.  Uncontrolled diabetes.  A weakened defense (immune) system. What increases the risk? This condition is more likely to develop in women who:  Smoke.  Use vaginal douches, scented tampons, or scented sanitary pads.  Wear tight-fitting pants.  Wear thong underwear.  Use oral birth control pills or an IUD.  Have sex without a condom.  Have multiple sex partners.  Have an STD.  Frequently use the spermicide nonoxynol-9.  Eat lots of foods high in sugar.  Have uncontrolled diabetes.  Have low estrogen levels.  Have a weakened immune system from an immune disorder or medical  treatment.  Are pregnant or breastfeeding. What are the signs or symptoms? Symptoms vary depending on the cause of the vaginitis. Common symptoms include:  Abnormal vaginal discharge. ? The discharge is white, gray, or yellow with bacterial vaginosis. ? The discharge is thick, white, and cheesy with a yeast infection. ? The discharge is frothy and yellow or greenish with trichomoniasis.  A bad vaginal smell. The smell is fishy with bacterial vaginosis.  Vaginal itching, pain, or swelling.  Sex that is painful.  Pain or burning when urinating. Sometimes there are no symptoms. How is this diagnosed? This condition is diagnosed based on your symptoms and medical history. A physical exam, including a pelvic exam, will also be done. You may also have other tests, including:  Tests to determine the pH level (acidity or alkalinity) of your vagina.  A whiff test, to assess the odor that results when a sample of your vaginal discharge is mixed with a potassium hydroxide solution.  Tests of vaginal fluid. A sample will be examined under a microscope. How is this treated? Treatment varies depending on the type of vaginitis you have. Your treatment may include:  Antibiotic creams or pills to treat bacterial vaginosis and trichomoniasis.  Antifungal medicines, such as vaginal creams or suppositories, to treat a yeast infection.  Medicine to ease discomfort if you have viral vaginitis. Your sexual partner should also be treated.  Estrogen delivered in a cream, pill, suppository, or vaginal ring to treat atrophic vaginitis. If vaginal dryness occurs, lubricants and moisturizing creams may help. You may need to avoid scented soaps, sprays, or douches.  Stopping use of a product that is causing allergic vaginitis. Then using a vaginal cream to treat the symptoms. Follow   these instructions at home: Lifestyle  Keep your genital area clean and dry. Avoid soap, and only rinse the area with  water.  Do not douche or use tampons until your health care provider says it is okay to do so. Use sanitary pads, if needed.  Do not have sex until your health care provider approves. When you can return to sex, practice safe sex and use condoms.  Wipe from front to back. This avoids the spread of bacteria from the rectum to the vagina. General instructions  Take over-the-counter and prescription medicines only as told by your health care provider.  If you were prescribed an antibiotic medicine, take or use it as told by your health care provider. Do not stop taking or using the antibiotic even if you start to feel better.  Keep all follow-up visits as told by your health care provider. This is important. How is this prevented?  Use mild, non-scented products. Do not use things that can irritate the vagina, such as fabric softeners. Avoid the following products if they are scented: ? Feminine sprays. ? Detergents. ? Tampons. ? Feminine hygiene products. ? Soaps or bubble baths.  Let air reach your genital area. ? Wear cotton underwear to reduce moisture buildup. ? Avoid wearing underwear while you sleep. ? Avoid wearing tight pants and underwear or nylons without a cotton panel. ? Avoid wearing thong underwear.  Take off any wet clothing, such as bathing suits, as soon as possible.  Practice safe sex and use condoms. Contact a health care provider if:  You have abdominal pain.  You have a fever.  You have symptoms that last for more than 2-3 days. Get help right away if:  You have a fever and your symptoms suddenly get worse. Summary  Vaginitis is a condition in which the vaginal tissue becomes inflamed.This condition is most often caused by a change in the normal balance of bacteria and yeast that live in the vagina.  Treatment varies depending on the type of vaginitis you have.  Do not douche, use tampons , or have sex until your health care provider approves. When  you can return to sex, practice safe sex and use condoms. This information is not intended to replace advice given to you by your health care provider. Make sure you discuss any questions you have with your health care provider. Document Revised: 04/23/2017 Document Reviewed: 06/16/2016 Elsevier Patient Education  2020 Elsevier Inc.  

## 2020-03-16 LAB — URINE CULTURE: Organism ID, Bacteria: NO GROWTH

## 2020-03-16 LAB — HERPES SIMPLEX VIRUS CULTURE

## 2020-03-16 NOTE — Progress Notes (Signed)
Patient ID: Cathy Benjamin, female   DOB: 03-Nov-1990, 29 y.o.   MRN: [id number]  Reason for Consult: Urinary Tract Infection (redness, irritation, sore, vaginitis, possible BV )    Subjective:  Date of Service: 03/14/2020 Note written: 03/16/2020 with updated labs  HPI:   Cathy Benjamin is a 29 y.o. female being seen for ongoing and worsening vaginal irritation symptoms. She was seen a week ago and diagnosed with BV vs Yeast. Rx's Diflucan and Metro gel sent and she used metro gel for 2 days and continued to have irritation. She had urinary symptoms at the same time and left the office without leaving a urine specimen. She then went to [facility] 2 days later for a second evaluation. She was diagnosed with a UTI based on urinalysis and was prescribed Cipro for UTI and Clindamycin as an alternative treatment for BV. She took both of those medications for 2 days without any relief. She also took a dose of Diflucan without relief.   Four days later (today) her symptoms have worsened, with increased thick yellow green discharge, vaginal irritation with bleeding from skin irritation, odor, some itching and pain with walking from the irritation. She admits not drinking enough water due to the extreme pain she feels on the irritated vaginal tissue. She took Naproxen once with some relief from the pain. She did try soaking in the tub with apple cider vinegar which also offered some relief. It appears from urinalysis that she has a UTI, however, urine culture is negative. It is possible that the abnormal UA results are due to the vaginal infection process/dehydration. Initial STD results are negative.  We discussed that based on lab results she did not need to take either metro gel or clindamycin. I also informed her that sometimes a single dose of diflucan is not adequate and a second dose is needed 3 days later. We decided to re-check vaginitis labs and send urine for a culture. Given the clusters of  blisters seen on exam I recommended doing an HSV culture to check for herpes infection. It is possibly only irritation from her vaginal infection.   Past Medical History:  Diagnosis Date  . Headache   . Left breast lump 2019   caused by caffeine  . SVD (spontaneous vaginal delivery) 07/05/2016  . VBAC, delivered, current hospitalization 07/05/2016   History reviewed. No pertinent family history. Past Surgical History:  Procedure Laterality Date  . CESAREAN SECTION N/A 11/17/2014   Procedure: CESAREAN SECTION;  Surgeon: Honor Loh Ward, MD;  Location: [facility] ORS;  Service: Obstetrics;  Laterality: N/A;  . LASIK      Short Social History:  Social History   Tobacco Use  . Smoking status: Former Research scientist (life sciences)  . Smokeless tobacco: Former Network engineer Use Topics  . Alcohol use: Yes    Comment: socially    No Known Allergies  Current Outpatient Medications  Medication Sig Dispense Refill  . levonorgestrel (MIRENA) 20 MCG/24HR IUD 1 each by Intrauterine route once.    . cephALEXin (KEFLEX) 500 MG capsule Take 1 capsule (500 mg total) by mouth 4 (four) times daily. 28 capsule 0  . lidocaine (XYLOCAINE) 2 % jelly Apply 1 application topically as needed. 30 mL 1  . Prenatal Vit-Fe Fumarate-FA (PRENATAL MULTIVITAMIN) TABS tablet Take 1 tablet by mouth daily at 12 noon. (Patient not taking: Reported on 03/08/2020)     No current facility-administered medications for this visit.    Review of Systems  Constitutional: Negative for  chills and fever.  HENT: Negative for congestion, ear discharge, ear pain, hearing loss, sinus pain and sore throat.   Eyes: Negative for blurred vision and double vision.  Respiratory: Negative for cough, shortness of breath and wheezing.   Cardiovascular: Negative for chest pain, palpitations and leg swelling.  Gastrointestinal: Negative for abdominal pain, blood in stool, constipation, diarrhea, heartburn, melena, nausea and vomiting.  Genitourinary: Negative for  dysuria, flank pain, frequency, hematuria and urgency.       Positive for vaginal pain, irritation, discharge, odor, itching, dark urine  Musculoskeletal: Negative for back pain, joint pain and myalgias.  Skin: Negative for itching and rash.  Neurological: Negative for dizziness, tingling, tremors, sensory change, speech change, focal weakness, seizures, loss of consciousness, weakness and headaches.  Endo/Heme/Allergies: Negative for environmental allergies. Does not bruise/bleed easily.  Psychiatric/Behavioral: Negative for depression, hallucinations, memory loss, substance abuse and suicidal ideas. The patient is not nervous/anxious and does not have insomnia.        Objective:  Objective   Vitals:   03/14/20 1135  BP: 115/79  Pulse: (!) 105  SpO2: 98%  Weight: 137 lb (62.1 kg)  Height: 5\' 1"  (1.549 m)   Body mass index is 25.89 kg/m. Constitutional: Well nourished, well developed female in no acute distress.  HEENT: normal Skin: Warm and dry.  Cardiovascular: Regular rate and rhythm.   Extremity: no edema Respiratory: Clear to auscultation bilateral. Normal respiratory effort Back: no CVAT Neuro: DTRs 2+, Cranial nerves grossly intact Psych: Alert and Oriented x3. No memory deficits. Normal mood and affect.  MS: normal gait, normal bilateral lower extremity ROM/strength/stability.  Pelvic exam: (female chaperone present) is not limited by body habitus EGBUS: with inflammation of labia, clusters of blistery rash on bilateral buttocks Vagina: inflamed vaginal tissue, thick yellow/green discharge, excoriated skin with bleeding Cervix: not evaluated   Data: Results for KANDYCE, DIEGUEZ (MRN [id number]) as of 03/16/2020 14:33  Ref. Range 03/14/2020 11:44 03/14/2020 13:59  Bilirubin, UA Unknown Large (3+)   Color, UA Unknown dark   Glucose Latest Ref Range: Negative  Negative   Ketones, UA Unknown Large (3+)   Leukocytes,UA Latest Ref Range: Negative  Large (3+) (A)    Nitrite, UA Unknown neg   pH, UA Latest Ref Range: 5.0 - 8.0  7.0   Protein,UA Latest Ref Range: Negative  Positive (A)   Specific Gravity, UA Latest Ref Range: 1.010 - 1.025  1.025   Urobilinogen, UA Latest Ref Range: 0.2 or 1.0 E.U./dL 4.0 (A)   RBC, UA Unknown Large (3+)   Organism ID, Bacteria Unknown  No growth  URINE CULTURE Unknown  Rpt   Still waiting for results of repeat aptima and HSV culture Assessment/Plan:     29 y.o. G3 P69 female with worsening vaginal infection   Urine culture Repeat aptima: vaginitis HSV culture Rx Diflucan based on first aptima results Rx Keflex based on UA results: will call patient with urine culture results Rx Lidocaine spray Recommend: soak in tub with sea salt or apple cider vinegar Try natural baby diaper rash healing topical lotion, cotton underwear, loose fitting clothes, gentle body care/laundry products   [facility] 03/16/2020, 3:00 PM

## 2020-03-17 ENCOUNTER — Other Ambulatory Visit: Payer: Self-pay | Admitting: Advanced Practice Midwife

## 2020-03-17 DIAGNOSIS — A609 Anogenital herpesviral infection, unspecified: Secondary | ICD-10-CM

## 2020-03-17 MED ORDER — VALACYCLOVIR HCL 500 MG PO TABS: 500.0000 mg | ORAL_TABLET | Freq: Every day | ORAL | 11 refills | Status: DC

## 2020-03-17 MED ORDER — VALACYCLOVIR HCL 1 G PO TABS: 1000.0000 mg | ORAL_TABLET | Freq: Two times a day (BID) | ORAL | 0 refills | Status: AC

## 2020-03-17 NOTE — Progress Notes (Signed)
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 She is feeling a little better today. Rx sent for initial outbreak treatment and for daily suppressive treatment.

## 2020-03-20 LAB — CERVICOVAGINAL ANCILLARY ONLY
Bacterial Vaginitis (gardnerella): NEGATIVE
Comment: NEGATIVE
Comment: NEGATIVE
Comment: NEGATIVE

## 2020-06-11 IMAGING — CT CT ANGIO CHEST
4 of 7 series · 19 of 46 positions shown · IV contrast (APPLIED)
Comparison: Chest radiographs 04/22/2019

CLINICAL DATA: Chest pain radiating to the back and jaw.
Right-sided neck pain. Right facial numbness.

EXAM:
CT ANGIOGRAPHY CHEST WITH CONTRAST
TECHNIQUE: Multidetector CT imaging of the chest was performed using the
standard protocol during bolus administration of intravenous
contrast. Multiplanar CT image reconstructions and MIPs were
obtained to evaluate the vascular anatomy.
CONTRAST:  100mL OMNIPAQUE IOHEXOL 350 MG/ML SOLN

[Series 4: axial pre · axial · non-contrast · 0.64mm/px · z∈[-395,-225]mm · 4 of 58 slices shown]
[im 12/58  lung]
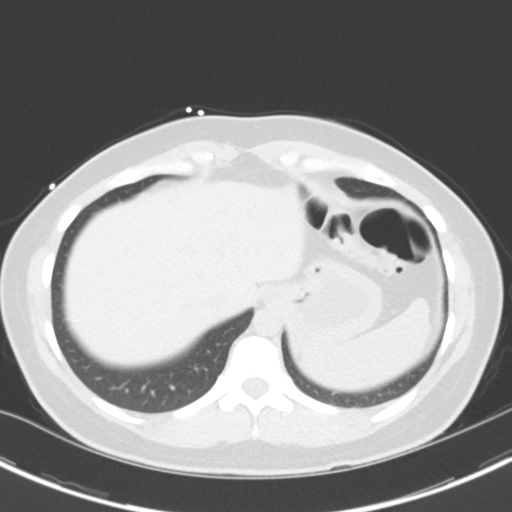
[im 23/58  lung]
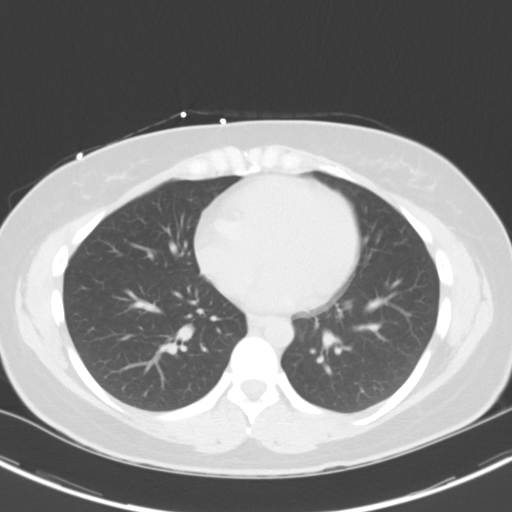
[im 35/58  lung]
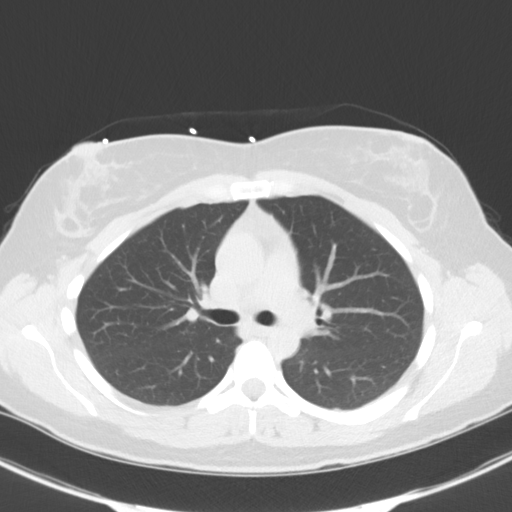
[im 46/58  lung]
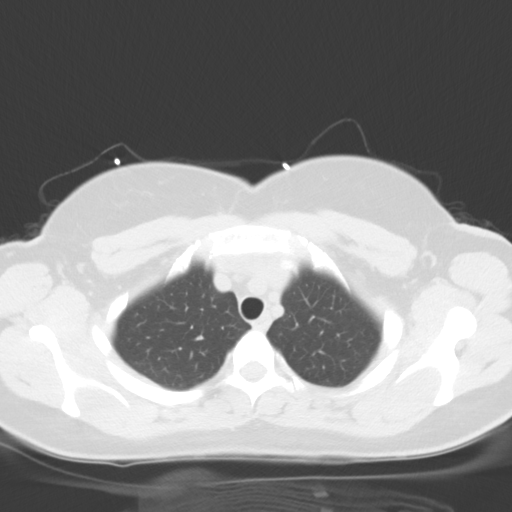

[Series 10: axial arterial · axial · arterial · 0.64mm/px · z∈[-426,-198]mm · 8 of 98 slices shown]
[im 11/98  lung]
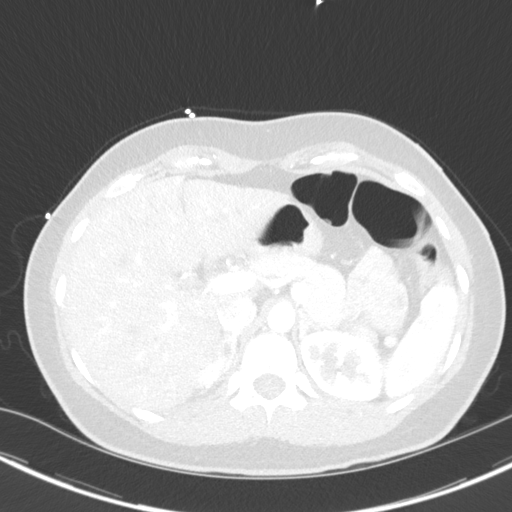
[im 22/98  soft-tissue]
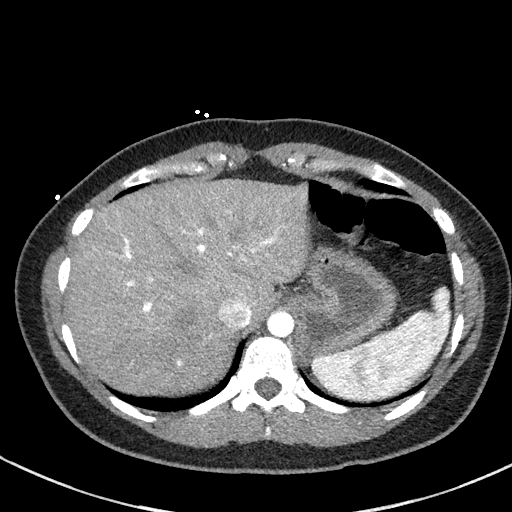
[im 33/98  lung]
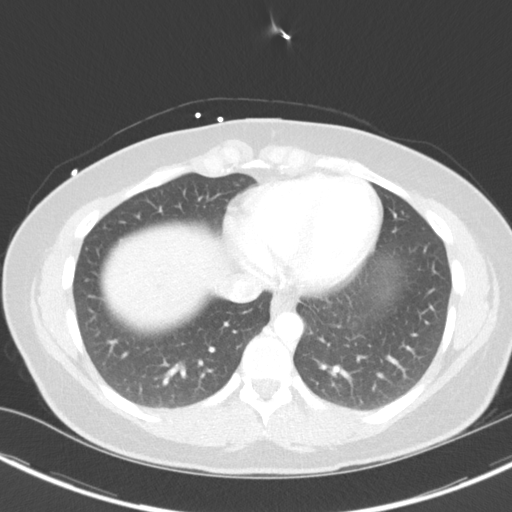
[im 44/98  soft-tissue]
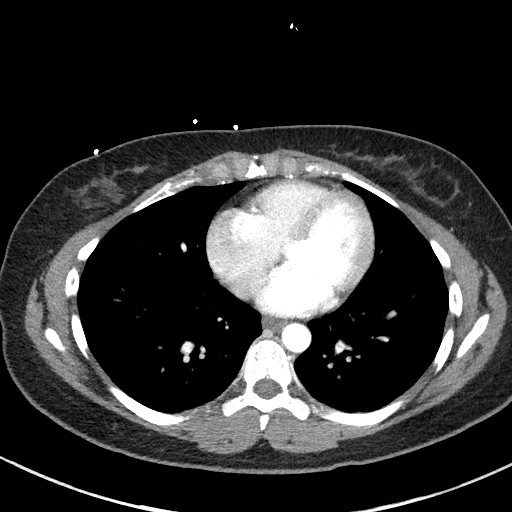
[im 54/98  lung]
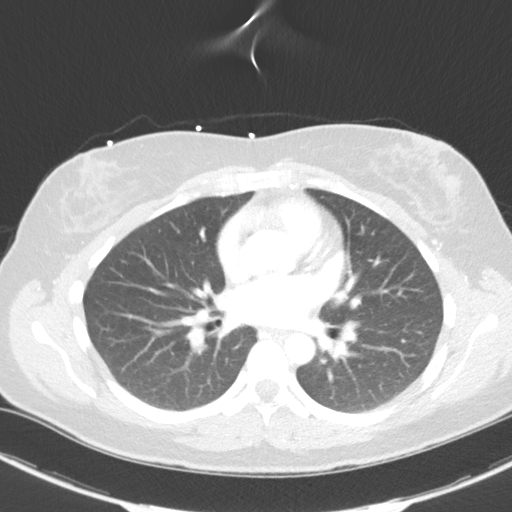
[im 65/98  soft-tissue]
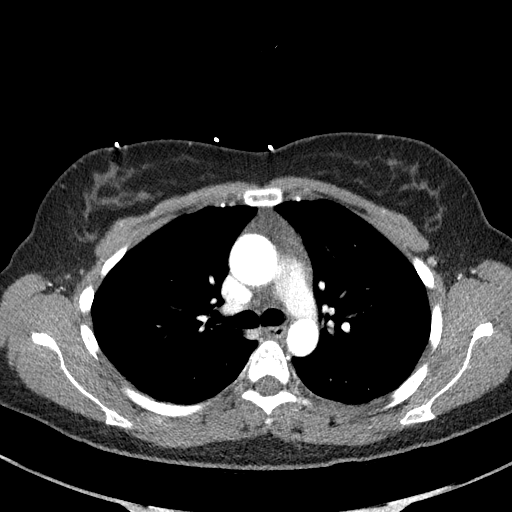
[im 76/98  lung]
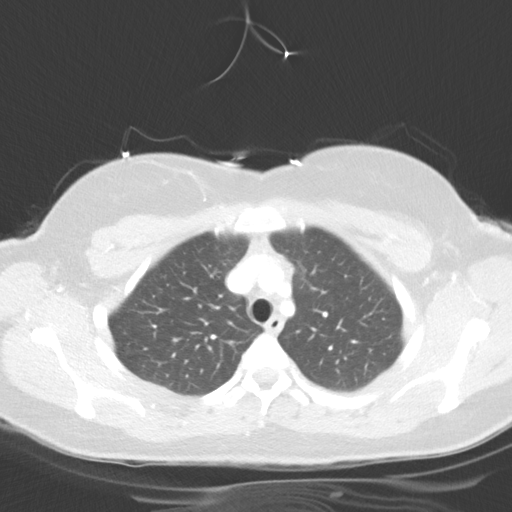
[im 87/98  soft-tissue]
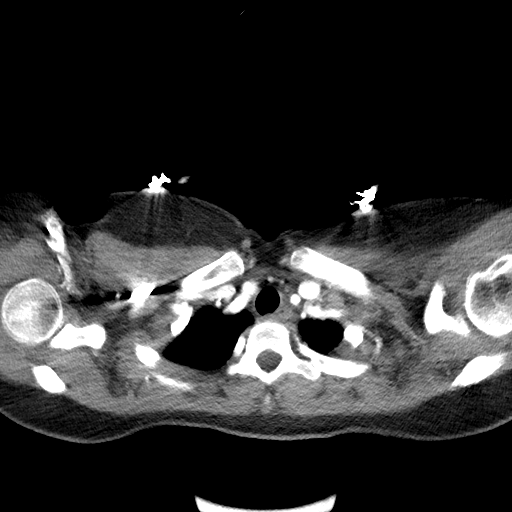

[Series 11: lung · axial · 0.64mm/px · z∈[-439,-321]mm · 4 of 147 slices shown]
[im 10/147  soft-tissue]
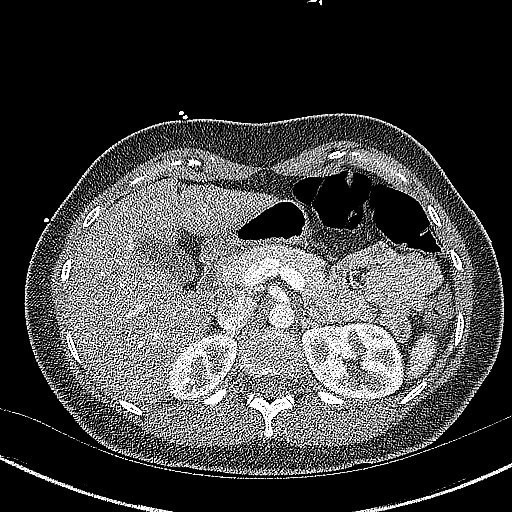
[im 30/147  soft-tissue]
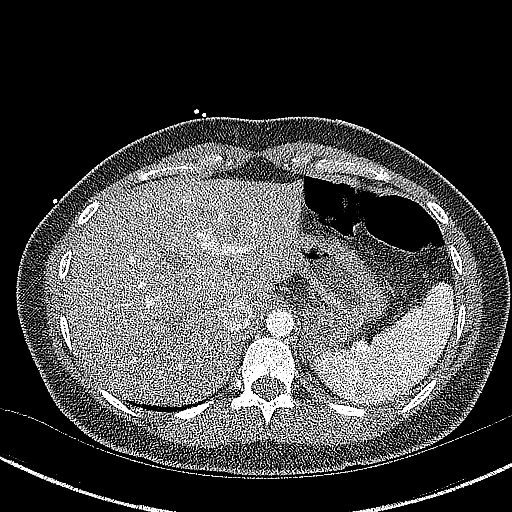
[im 49/147  soft-tissue]
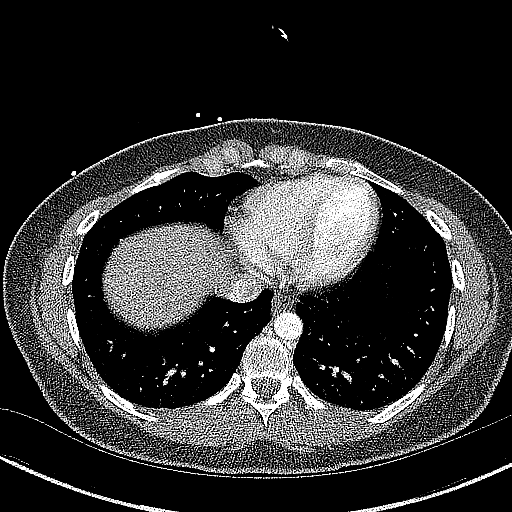
[im 69/147  soft-tissue]
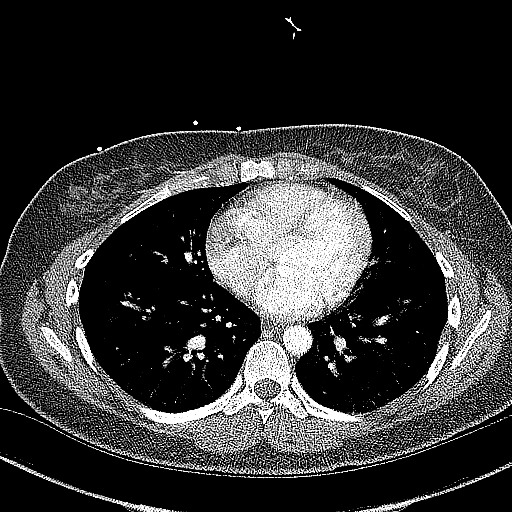

[Series 12: coronals · coronal · 0.64mm/px · 3 of 106 slices shown]
[im 27/106  soft-tissue]
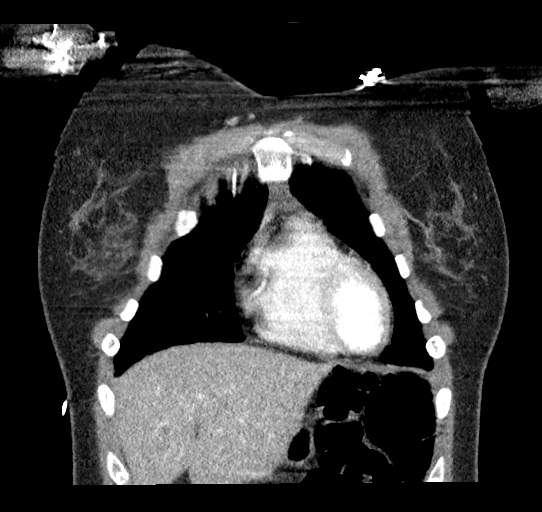
[im 53/106  soft-tissue]
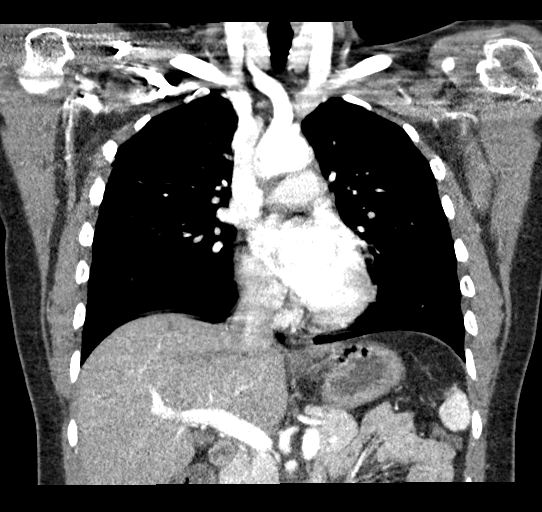
[im 79/106  soft-tissue]
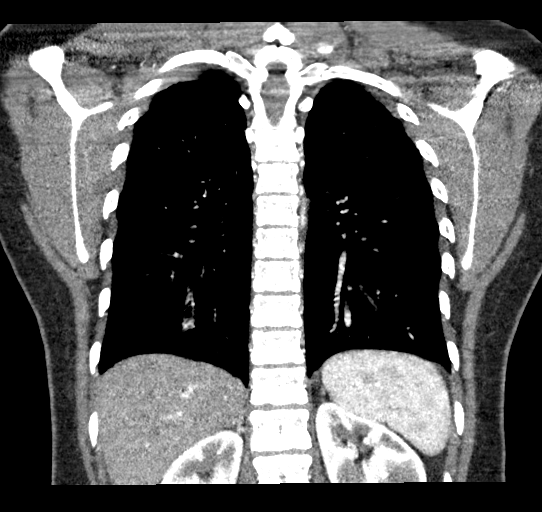

[19 of 46 positions shown; findings below may reference images not displayed]

FINDINGS: Cardiovascular: No thoracic aortic intramural hematoma is evident on
noncontrast images. There is no evidence of thoracic aortic
dissection or aneurysm. Pulmonary artery patency is not evaluated
due to contrast timing. The heart is normal in size. There is no
pericardial effusion.

Mediastinum/Nodes: No enlarged axillary, mediastinal, or hilar lymph
nodes. Unremarkable esophagus and included thyroid.

Lungs/Pleura: No pleural effusion or pneumothorax. Clear lungs.

Upper Abdomen: Unremarkable.

Musculoskeletal: No acute osseous abnormality or suspicious osseous
lesion.

Review of the MIP images confirms the above findings.
IMPRESSION: Negative chest CTA.

## 2020-10-08 ENCOUNTER — Ambulatory Visit: Payer: 59 | Admitting: Obstetrics and Gynecology

## 2020-10-23 ENCOUNTER — Ambulatory Visit: Payer: 59 | Admitting: Obstetrics and Gynecology

## 2020-11-19 ENCOUNTER — Ambulatory Visit (INDEPENDENT_AMBULATORY_CARE_PROVIDER_SITE_OTHER): Payer: 59 | Admitting: Obstetrics and Gynecology

## 2020-11-19 ENCOUNTER — Encounter: Payer: Self-pay | Admitting: Obstetrics and Gynecology

## 2020-11-19 ENCOUNTER — Other Ambulatory Visit: Payer: Self-pay

## 2020-11-19 ENCOUNTER — Other Ambulatory Visit (HOSPITAL_COMMUNITY)
Admission: RE | Admit: 2020-11-19 | Discharge: 2020-11-19 | Disposition: A | Discharge status: Home / Self Care | Payer: 59 | Source: Ambulatory Visit | Attending: Obstetrics and Gynecology | Admitting: Obstetrics and Gynecology

## 2020-11-19 VITALS — BP 110/80 | Ht 61.0 in | Wt 150.0 lb

## 2020-11-19 DIAGNOSIS — N930 Postcoital and contact bleeding: Secondary | ICD-10-CM | POA: Diagnosis not present

## 2020-11-19 DIAGNOSIS — Z01419 Encounter for gynecological examination (general) (routine) without abnormal findings: Secondary | ICD-10-CM

## 2020-11-19 DIAGNOSIS — Z124 Encounter for screening for malignant neoplasm of cervix: Secondary | ICD-10-CM | POA: Diagnosis not present

## 2020-11-19 DIAGNOSIS — Z30432 Encounter for removal of intrauterine contraceptive device: Secondary | ICD-10-CM | POA: Diagnosis not present

## 2020-11-19 DIAGNOSIS — R519 Headache, unspecified: Secondary | ICD-10-CM

## 2020-11-19 NOTE — Progress Notes (Signed)
PCP:  Patient, No Pcp Per (Inactive)   Chief Complaint  Patient presents with   Gynecologic Exam    IUD removal, not interested in new [facility]     HPI:      Cathy Benjamin. Cathy Benjamin is a 30 y.o. [id number] whose LMP was No LMP recorded. (Menstrual status: IUD)., presents today for her annual examination.  Her menses are regular every 28-30 days, lasting 6-7 days, dark spotting only with IUD.  Dysmenorrhea none. She does not have intermenstrual bleeding.  Sex activity: single partner, contraception - IUD. Mirena placed 08/05/2016; would like removed for conception. Not taking PNVs yet. Has occas red/pink d/c with wiping for a few hrs after sex.  Last Pap: not recent; no hx of abn paps with bx. 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 Uses dove sens skin soap and dryer sheets. No thongs. Is taking probiotics. Notes certain foods are triggers. Pt wonders if IUD a cause.    There is no FH of breast cancer. There is no FH of ovarian cancer. The patient does do self-breast exams. 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 Bary Castilla. No change per pt.   Tobacco use: vapes occas Alcohol use: occas No drug use.  Exercise: not active  She does not get adequate calcium and Vitamin D in her diet.  Has been having increased headache frequency recently, occurring about 5 days a wk. Takes excedrin with relief. Hx of headaches in past, just now more frequent. Occas triggers migraines. FH migraine headaches. Has had normal recent eye exam.   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   Genital herpes 02/2020   type 1 on culture   Headache    SVD (spontaneous vaginal delivery) 07/05/2016   VBAC, delivered, current hospitalization 07/05/2016    Past Surgical History:  Procedure Laterality Date   CESAREAN SECTION N/A 11/17/2014   Procedure: CESAREAN SECTION;  Surgeon: Honor Loh Ward, MD;   Location: [facility] ORS;  Service: Obstetrics;  Laterality: N/A;   LASIK      Family History  Problem Relation Age of Onset   Testicular cancer Father        56s    Social History   Socioeconomic History   Marital status: Single    Spouse name: Not on file   Number of children: 2   Years of education: Not on file   Highest education level: Not on file  Occupational History   Not on file  Tobacco Use   Smoking status: Former    Pack years: 0.00   Smokeless tobacco: Former  Scientific laboratory technician Use: Never used  Substance and Sexual Activity   Alcohol use: Yes    Comment: socially   Drug use: No   Sexual activity: Yes    Birth control/protection: I.U.D.    Comment: Mirena  Other Topics Concern   Not on file  Social History Narrative   Not on file   Social Determinants of Health   Financial Resource Strain: Not on file  Food Insecurity: Not on file  Transportation Needs: Not on file  Physical Activity: Not on file  Stress: Not on file  Social Connections: Not on file  Intimate Partner Violence: Not on file    No current outpatient medications on file.     ROS:  Review of Systems  Constitutional:  Negative for fatigue, fever and unexpected weight change.  Respiratory:  Negative for cough, shortness of breath and wheezing.   Cardiovascular:  Negative for chest pain, palpitations and leg swelling.  Gastrointestinal:  Negative for blood in stool, constipation, diarrhea, nausea and vomiting.  Endocrine: Negative for cold intolerance, heat intolerance and polyuria.  Genitourinary:  Negative for dyspareunia, dysuria, flank pain, frequency, genital sores, hematuria, menstrual problem, pelvic pain, urgency, vaginal bleeding, vaginal discharge and vaginal pain.  Musculoskeletal:  Negative for back pain, joint swelling and myalgias.  Skin:  Negative for rash.  Neurological:  Positive for headaches. Negative for dizziness, syncope, light-headedness and numbness.   Hematological:  Negative for adenopathy.  Psychiatric/Behavioral:  Negative for agitation, confusion, sleep disturbance and suicidal ideas. The patient is not nervous/anxious.   BREAST: No symptoms   Objective: BP 110/80   Ht 5\' 1"  (1.549 m)   Wt 150 lb (68 kg)   BMI 28.34 kg/m    Physical Exam Constitutional:      Appearance: She is well-developed.  Genitourinary:     Vulva normal.     Right Labia: No rash, tenderness or lesions.    Left Labia: No tenderness, lesions or rash.    No vaginal discharge, erythema or tenderness.      Right Adnexa: not tender and no mass present.    Left Adnexa: not tender and no mass present.    No cervical friability or polyp.     No IUD strings visualized.     Cervical exam comments: IUD STRINGS NOT IN CX OS.     Uterus is not enlarged or tender.  Breasts:    Right: No mass, nipple discharge, skin change or tenderness.     Left: No mass, nipple discharge, skin change or tenderness.  Neck:     Thyroid: No thyromegaly.  Cardiovascular:     Rate and Rhythm: Normal rate and regular rhythm.     Heart sounds: Normal heart sounds. No murmur heard. Pulmonary:     Effort: Pulmonary effort is normal.     Breath sounds: Normal breath sounds.  Abdominal:     Palpations: Abdomen is soft.     Tenderness: There is no abdominal tenderness. There is no guarding or rebound.  Musculoskeletal:        General: Normal range of motion.     Cervical back: Normal range of motion.  Lymphadenopathy:     Cervical: No cervical adenopathy.  Neurological:     General: No focal deficit present.     Mental Status: She is alert and oriented to person, place, and time.     Cranial Nerves: No cranial nerve deficit.  Skin:    General: Skin is warm and dry.  Psychiatric:        Mood and Affect: Mood normal.        Behavior: Behavior normal.        Thought Content: Thought content normal.        Judgment: Judgment normal.  Vitals reviewed.    IUD  Removal Strings of IUD identified and grasped.  IUD removed without problem with Boseman forceps.  Pt tolerated this well.  IUD noted to be intact.   Assessment/Plan: Encounter for annual routine gynecological examination  Cervical cancer screening - Plan: Cytology - PAP  Postcoital bleeding--cx not friable, no polyp. Check pap. If neg, see if sx improve after IUD removal.   Encounter for IUD removal--tolerated well. Start PNVs. F/u prn NOB  Frequent headaches--question rebound headaches.  Pt to keep journal and f/u with PCP.     GYN counsel adequate intake of calcium and vitamin D, diet and exercise     F/U  Return in about 1 year (around 11/19/2021).  Patrica Mendell B. Sumiya Mamaril, PA-C 11/19/2020 4:03 PM

## 2020-11-22 LAB — CYTOLOGY - PAP: Diagnosis: NEGATIVE

## 2021-10-22 ENCOUNTER — Ambulatory Visit (INDEPENDENT_AMBULATORY_CARE_PROVIDER_SITE_OTHER): Payer: 59

## 2021-10-22 DIAGNOSIS — Z3689 Encounter for other specified antenatal screening: Secondary | ICD-10-CM

## 2021-10-22 DIAGNOSIS — Z3480 Encounter for supervision of other normal pregnancy, unspecified trimester: Secondary | ICD-10-CM | POA: Insufficient documentation

## 2021-10-22 NOTE — Progress Notes (Signed)
New OB Intake  I connected with  Cathy Benjamin on 10/22/21 at  8:15 AM EDT by telephone Video Visit and verified that I am speaking with the correct person using two identifiers. Nurse is located at [employer] and pt is located at home.  I explained I am completing New OB Intake today. We discussed her EDD of 05/18/2022 that is based on LMP of 08/11/2021.This was the last normal period; the next period was lighter and shorter.  Pt is G4/P2012. I reviewed her allergies, medications, Medical/Surgical/OB history, and appropriate screenings. Based on history, this is a/an pregnancy uncomplicated .   There are no problems to display for this patient.   Concerns addressed today None  Delivery Plans:  Plans to deliver at [facility]  Anatomy US Explained first scheduled [place] for dating will be scheduled for 1-2 weeks after NOB appt.  Labs Patient aware genetic testing to be drawn at new OB visit if desired and at least 10 weeks. Discussed possible labs to be drawn at new OB appointment.  COVID Vaccine Patient has not had COVID vaccine.   Social Determinants of Health Food Insecurity: denies food insecurity Transportation: Patient denies transportation needs. I reviewed new OB appt with pt. I explained she will have ob bloodwork and pap smear/pelvic exam if indicated. Explained pt will be seen by Gigi Gin, CNM at first visit; encounter routed to appropriate provider.   Cleophas Dunker, [facility] 10/22/2021  8:40 AM  Clinical Staff Provider  Office Location [facility] Dating    Language  English Anatomy US    Flu Vaccine   Genetic Screen  NIPS:   TDaP vaccine    Hgb A1C or  GTT Early : Third trimester :   Covid no   LAB RESULTS   Rhogam   Blood Type     Feeding Plan bottle Antibody    Contraception tubal Rubella    Circumcision yes RPR     Pediatrician  [name] - Dr. Berline Lopes HBsAg     Support Person [name] HIV    Prenatal Classes no Varicella     GBS  (For  PCN allergy, check sensitivities)   BTL Consent desires Hep C   VBAC Consent possible Pap      Hgb Electro      CF      SMA

## 2021-11-05 ENCOUNTER — Other Ambulatory Visit (HOSPITAL_COMMUNITY)
Admission: RE | Admit: 2021-11-05 | Discharge: 2021-11-05 | Disposition: A | Discharge status: Home / Self Care | Payer: 59 | Source: Ambulatory Visit | Attending: Obstetrics | Admitting: Obstetrics

## 2021-11-05 ENCOUNTER — Ambulatory Visit (INDEPENDENT_AMBULATORY_CARE_PROVIDER_SITE_OTHER): Payer: 59 | Admitting: Obstetrics

## 2021-11-05 VITALS — BP 126/70 | Wt 153.0 lb

## 2021-11-05 DIAGNOSIS — Z124 Encounter for screening for malignant neoplasm of cervix: Secondary | ICD-10-CM | POA: Diagnosis present

## 2021-11-05 DIAGNOSIS — Z1379 Encounter for other screening for genetic and chromosomal anomalies: Secondary | ICD-10-CM

## 2021-11-05 DIAGNOSIS — Z3480 Encounter for supervision of other normal pregnancy, unspecified trimester: Secondary | ICD-10-CM

## 2021-11-05 DIAGNOSIS — Z3A12 12 weeks gestation of pregnancy: Secondary | ICD-10-CM

## 2021-11-05 NOTE — Progress Notes (Signed)
New Obstetric Patient H&P    Chief Complaint: "Desires prenatal care"   History of Present Illness: Patient is a 31 y.o. [id number] Not Hispanic or Latino female, LMP 08/11/2021 presents with amenorrhea and positive home pregnancy test. Based on her  LMP, her EDD is Estimated Date of Delivery: 05/18/22 and her EGA is [redacted]w[redacted]d Cycles are 5. days, regular, and occur approximately every : 28 days. Her last pap smear was a few years ago and was results unavailable.    She had a urine pregnancy test which was positive about 2 week(s)  ago. Her last menstrual period was normal and lasted for  about 4 day(s). Since her LMP she claims she has experienced nausea and breast tenderness. She denies vaginal bleeding. Her past medical history is noncontributory. Her prior pregnancies are notable for one CS and thena VBAC  Since her LMP, she admits to the use of tobacco products  no She claims she has gained   no pounds since the start of her pregnancy.  There are cats in the home in the home  no She admits close contact with children on a regular basis  yes  She has had chicken pox in the past yes She has had Tuberculosis exposures, symptoms, or previously tested positive for TB   no Current or past history of domestic violence. no  Genetic Screening/Teratology Counseling: (Includes patient, baby's father, or anyone in either family with:)   160 [facility]'s [facility] no 2. Thalassemia ([place] or [place] background): MCV<80  no 3. Neural tube defect (meningomyelocele, spina bifida, anencephaly)  no 4. Congenital heart defect  no  5. Down syndrome  no 6. Tay-Sachs (Jewish, [place]  no 7. Canavan's Disease  no 8. Sickle cell disease or trait (African)  no  9. Hemophilia or other blood disorders  no  10. Muscular dystrophy  no  11. Cystic fibrosis  no  12. Huntington's Chorea  no  13. Mental retardation/autism  no 14. Other inherited genetic or chromosomal  disorder  no 15. Maternal metabolic disorder (DM, PKU, etc)  no 16. Patient or FOB with a child with a birth defect not listed above no  16a. Patient or FOB with a birth defect themselves no 17. Recurrent pregnancy loss, or stillbirth  no  18. Any medications since LMP other than prenatal vitamins (include vitamins, supplements, OTC meds, drugs, alcohol)  no 19. Any other genetic/environmental exposure to discuss  no  Infection History:   1. Lives with someone with TB or TB exposed  no  2. Patient or partner has history of genital herpes  no 3. Rash or viral illness since LMP  no 4. History of STI (GC, CT, HPV, syphilis, HIV)  no 5. History of recent travel :  no  Other pertinent information:  Yes, she desires another VBAC.     Review of Systems:10 point review of systems negative unless otherwise noted in HPI  Past Medical History:  Past Medical History:  Diagnosis Date  . 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 Genital herpes 02/2020   type 1 on culture  . Headache   . SVD (spontaneous vaginal delivery) 07/05/2016  . VBAC, delivered, current hospitalization 07/05/2016    Past Surgical History:  Past Surgical History:  Procedure Laterality Date  . CESAREAN SECTION N/A 11/17/2014   Procedure: CESAREAN SECTION;  Surgeon: CHonor LohWard, MD;  Location: [facility] ORS;  Service: Obstetrics;  Laterality: N/A;  . LASIK      Gynecologic History: Patient's last menstrual period was 08/11/2021.  Obstetric History: [id number]  Family History:  Family History  Problem Relation Age of Onset  . Testicular cancer Father        39s    Social History:  Social History   Socioeconomic History  . Marital status: Married    Spouse name: Not on file  . Number of children: 2  . Years of education: 71  . Highest education level: Not on file  Occupational History  . Occupation: Merchant navy officer at [employer]  Tobacco Use  . Smoking status: Former  .  Smokeless tobacco: Never  Vaping Use  . Vaping Use: Former  Substance and Sexual Activity  . Alcohol use: Not Currently    Comment: socially  . Drug use: No  . Sexual activity: Yes    Birth control/protection: None    Comment: Mirena  Other Topics Concern  . Not on file  Social History Narrative  . Not on file   Social Determinants of Health   Financial Resource Strain: Low Risk  (10/22/2021)   Overall Financial Resource Strain (CARDIA)   . Difficulty of Paying Living Expenses: Not hard at all  Food Insecurity: No Food Insecurity (10/22/2021)   Hunger Vital Sign   . Worried About Charity fundraiser in the Last Year: Never true   . Ran Out of Food in the Last Year: Never true  Transportation Needs: No Transportation Needs (10/22/2021)   PRAPARE - Transportation   . Lack of Transportation (Medical): No   . Lack of Transportation (Non-Medical): No  Physical Activity: Unknown (10/22/2021)   Exercise Vital Sign   . Days of Exercise per Week: Not on file   . Minutes of Exercise per Session: 30 min  Stress: No Stress Concern Present (10/22/2021)   [place]   . Feeling of Stress : Not at all  Social Connections: Moderately Integrated (10/22/2021)   Social Connection and Isolation Panel [NHANES]   . Frequency of Communication with Friends and Family: More than three times a week   . Frequency of Social Gatherings with Friends and Family: More than three times a week   . Attends Religious Services: 1 to 4 times per year   . Active Member of Clubs or Organizations: No   . Attends Archivist Meetings: Never   . Marital Status: Married  Human resources officer Violence: Not At Risk (10/22/2021)   Humiliation, Afraid, Rape, and Kick questionnaire   . Fear of Current or Ex-Partner: No   . Emotionally Abused: No   . Physically Abused: No   . Sexually Abused: No    Allergies:  No Known Allergies  Medications: Prior  to Admission medications   Medication Sig Start Date End Date Taking? Authorizing Provider  Prenatal Vit-Fe Fumarate-FA (PRENATAL VITAMIN PO) Take by mouth.   Yes [provider]    Physical Exam Vitals: Blood pressure 126/70, weight 153 lb (69.4 kg), last menstrual period 08/11/2021, unknown if currently breastfeeding.  General: NAD HEENT: normocephalic, anicteric Thyroid: no enlargement, no palpable nodules Pulmonary: No increased work of breathing, CTAB Cardiovascular: RRR, distal pulses 2+ Abdomen: NABS, soft, non-tender, non-distended.  Umbilicus without lesions.  No hepatomegaly, splenomegaly or masses palpable. No evidence of hernia  Genitourinary:  External: Normal external female genitalia.  Normal urethral meatus, normal  Bartholin's and Skene's glands.  Vagina: Normal vaginal mucosa, no evidence of prolapse.    Cervix: Grossly normal in appearance, no bleeding  Uterus: retroverted, Non-enlarged, mobile, normal contour.  No CMT  NO FHTS HEARD VIA DOPPLER. Uterus feels less than [redacted] weeks gestation  Adnexa: ovaries non-enlarged, no adnexal masses  Rectal: deferred Extremities: no edema, erythema, or tenderness Neurologic: Grossly intact Psychiatric: mood appropriate, affect full   Assessment: 31 y.o. [id number] at 23w2dpresenting to initiate prenatal care  Plan: 1) Avoid alcoholic beverages. 2) Patient encouraged not to smoke.  3) Discontinue the use of all non-medicinal drugs and chemicals.  4) Take prenatal vitamins daily.  5) Nutrition, food safety (fish, cheese advisories, and high nitrite foods) and exercise discussed. 6) Hospital and practice style discussed with cross coverage system.  7) Genetic Screening, such as with 1st Trimester Screening, cell free fetal DNA, AFP testing, and Ultrasound, as well as with amniocentesis and CVS as appropriate, is discussed with patient. At the conclusion of today's visit patient requested genetic testing 8) Patient is  asked about travel to areas at risk for the Zika virus, and counseled to avoid travel and exposure to mosquitoes or sexual partners who may have themselves been exposed to the virus. Testing is discussed, and will be ordered as appropriate.  OB labs, including Maternt testing done today. I have ordered an expedited dating scan as I was not able to hear FHTs today via doppler. Her uterus is retroverte. F/U I 2- weeks to check dating. The following were addressed during this visit:  Breastfeeding Education - Early initiation of breastfeeding    Comments: Keeps milk supply adequate, helps contract uterus and slow bleeding, and early milk is the perfect first food and is easy to digest.   - The importance of exclusive breastfeeding    Comments: Provides antibodies, Lower risk of breast and ovarian cancers, and type-2 diabetes,Helps your body recover, Reduced chance of SIDS.   - Risks of giving your baby anything other than breast milk if you are breastfeeding    Comments: Make the baby less content with breastfeeds, may make my baby more susceptible to illness, and may reduce my milk supply.   - Nonpharmacological pain relief methods for labor    Comments: Deep breathing, focusing on pleasant things, movement and walking, heating pads or cold compress, massage and relaxation, continuous support from someone you trust, and Doulas   - The importance of early skin-to-skin contact    Comments: Keeps baby warm and secure, helps keep baby's blood sugar up and breathing steady, easier to bond and breastfeed, and helps calm baby.  - Rooming-in on a 24-hour basis    Comments: Easier to learn baby's feeding cues, easier to bond and get to know each other, and encourages milk production.   - Feeding on demand or baby-led feeding    Comments: Helps prevent breastfeeding complications, helps bring in good milk supply, prevents under or overfeeding, and helps baby feel content and satisfied   -  Frequent feeding to help assure optimal milk production    Comments: Making a full supply of milk requires frequent removal of milk from breasts, infant will eat 8-12 times in 24 hours, if separated from infant use breast massage, hand expression and/ or pumping to remove milk from breasts.   - Effective positioning and attachment    Comments: Helps my baby to get enough breast milk, helps to produce an adequate milk supply, and helps prevent nipple pain and damage   - Exclusive breastfeeding  for the first 6 months    Comments: Builds a healthy milk supply and keeps it up, protects baby from sickness and disease, and breastmilk has everything your baby needs for the first 6 months.  - Individualized Education    Comments: Contraindications to breastfeeding and other special medical conditions     Imagene Riches, CNM  11/05/2021 12:18 PM

## 2021-11-05 NOTE — Progress Notes (Signed)
NOB today.  

## 2021-11-06 ENCOUNTER — Other Ambulatory Visit: Payer: Self-pay | Admitting: Obstetrics

## 2021-11-06 ENCOUNTER — Ambulatory Visit (INDEPENDENT_AMBULATORY_CARE_PROVIDER_SITE_OTHER): Payer: 59

## 2021-11-06 DIAGNOSIS — Z3480 Encounter for supervision of other normal pregnancy, unspecified trimester: Secondary | ICD-10-CM

## 2021-11-06 LAB — RPR+RH+ABO+RUB AB+AB SCR+CB...
Antibody Screen: NEGATIVE
HIV Screen 4th Generation wRfx: NONREACTIVE
Hematocrit: 39 % (ref 34.0–46.6)
Hemoglobin: 13.3 g/dL (ref 11.1–15.9)
Hepatitis B Surface Ag: NEGATIVE
MCH: 30.4 pg (ref 26.6–33.0)
MCHC: 34.1 g/dL (ref 31.5–35.7)
MCV: 89 fL (ref 79–97)
Platelets: 213 10*3/uL (ref 150–450)
RBC: 4.37 x10E6/uL (ref 3.77–5.28)
RDW: 12.5 % (ref 11.7–15.4)
RPR Ser Ql: NONREACTIVE
Rh Factor: POSITIVE
Rubella Antibodies, IGG: 1.33 index (ref >0.99)
Varicella zoster IgG: 1278 index (ref >165)
WBC: 6.3 10*3/uL (ref 3.4–10.8)

## 2021-11-07 LAB — CYTOLOGY - PAP
Adequacy: ABSENT
Diagnosis: NEGATIVE
Diagnosis: REACTIVE

## 2021-11-07 LAB — URINE CULTURE

## 2021-11-07 LAB — CERVICOVAGINAL ANCILLARY ONLY
Bacterial Vaginitis (gardnerella): NEGATIVE
Chlamydia: NEGATIVE
Comment: NEGATIVE
Comment: NEGATIVE
Comment: NEGATIVE
Comment: NORMAL
Neisseria Gonorrhea: NEGATIVE
Trichomonas: NEGATIVE

## 2021-11-10 LAB — MATERNIT 21 PLUS CORE, BLOOD
Fetal Fraction: 7
Result (T21): NEGATIVE
Trisomy 13 (Patau syndrome): NEGATIVE
Trisomy 18 (Edwards syndrome): NEGATIVE
Trisomy 21 (Down syndrome): NEGATIVE

## 2021-11-19 ENCOUNTER — Ambulatory Visit (INDEPENDENT_AMBULATORY_CARE_PROVIDER_SITE_OTHER): Payer: 59 | Admitting: Obstetrics

## 2021-11-19 VITALS — BP 120/80 | Wt 152.0 lb

## 2021-11-19 DIAGNOSIS — Z3480 Encounter for supervision of other normal pregnancy, unspecified trimester: Secondary | ICD-10-CM

## 2021-11-19 DIAGNOSIS — Z3A09 9 weeks gestation of pregnancy: Secondary | ICD-10-CM

## 2021-11-19 NOTE — Progress Notes (Signed)
Routine Prenatal Care Visit  Subjective  Cathy Benjamin is a 31 y.o. [phone] at 54w0dbeing seen today for ongoing prenatal care.  She is currently monitored for the following issues for this low-risk pregnancy and has Supervision of other normal pregnancy, antepartum on their problem list.  ----------------------------------------------------------------------------------- Patient reports some occasional cramping; once per day. No bleeding. Her EDD is now changed based on the dating scan. she had MaternT testing come back with xy genderr. this was drawn before her 9 weeks but the report states ample sampling..   Contractions: Irritability. Vag. Bleeding: None.   . Leaking Fluid denies.  ----------------------------------------------------------------------------------- The following portions of the patient's history were reviewed and updated as appropriate: allergies, current medications, past family history, past medical history, past social history, past surgical history and problem list. Problem list updated.  Objective  Blood pressure 120/80, weight 152 lb (68.9 kg), last menstrual period 08/11/2021, unknown if currently breastfeeding. Pregravid weight 153 lb (69.4 kg) Total Weight Gain -1 lb (-0.454 kg) Urinalysis: Urine Protein    Urine Glucose    Fetal Status:           General:  Alert, oriented and cooperative. Patient is in no acute distress.  Skin: Skin is warm and dry. No rash noted.   Cardiovascular: Normal heart rate noted  Respiratory: Normal respiratory effort, no problems with respiration noted  Abdomen: Soft, gravid, appropriate for gestational age. Pain/Pressure: Absent     Pelvic:  Cervical exam deferred        Extremities: Normal range of motion.     Mental Status: Normal mood and affect. Normal behavior. Normal judgment and thought content.   Assessment   31y.o. [id number] 911w0dy  06/24/2022, by Ultrasound presenting for routine prenatal visit  Plan   fourth  Problems (from 10/22/21 to present)    No problems associated with this episode.       Preterm labor symptoms and general obstetric precautions including but not limited to vaginal bleeding, contractions, leaking of fluid and fetal movement were reviewed in detail with the patient. Please refer to After Visit Summary for other counseling recommendations.   Return in about 4 weeks (around 12/17/2021) for return OB.  MaImagene RichesCNM  11/19/2021 8:45 AM

## 2021-12-09 ENCOUNTER — Encounter: Payer: Self-pay | Admitting: Advanced Practice Midwife

## 2021-12-17 ENCOUNTER — Ambulatory Visit (INDEPENDENT_AMBULATORY_CARE_PROVIDER_SITE_OTHER): Payer: 59 | Admitting: Advanced Practice Midwife

## 2021-12-17 ENCOUNTER — Encounter: Payer: Self-pay | Admitting: Advanced Practice Midwife

## 2021-12-17 VITALS — BP 120/80 | Wt 155.0 lb

## 2021-12-17 DIAGNOSIS — Z98891 History of uterine scar from previous surgery: Secondary | ICD-10-CM | POA: Insufficient documentation

## 2021-12-17 DIAGNOSIS — Z3482 Encounter for supervision of other normal pregnancy, second trimester: Secondary | ICD-10-CM

## 2021-12-17 DIAGNOSIS — Z3A13 13 weeks gestation of pregnancy: Secondary | ICD-10-CM

## 2021-12-17 LAB — POCT URINALYSIS DIPSTICK OB
Glucose, UA: NEGATIVE
POC,PROTEIN,UA: NEGATIVE

## 2021-12-17 NOTE — Progress Notes (Signed)
Routine Prenatal Care Visit  Subjective  Cathy Benjamin is a 31 y.o. [phone] at 12w0dbeing seen today for ongoing prenatal care.  She is currently monitored for the following issues for this low-risk pregnancy and has Supervision of other normal pregnancy, antepartum; History of VBAC; and Genital herpes on their problem list.  ----------------------------------------------------------------------------------- Patient reports clear mucous vaginal discharge quarter size about once a week. She denies itching, irritation, odor or urinary symptoms. She has momentary bilateral low pelvic cramping from time to time.   Contractions: Not present. Vag. Bleeding: None.  Movement: Absent. Leaking Fluid denies.  ----------------------------------------------------------------------------------- The following portions of the patient's history were reviewed and updated as appropriate: allergies, current medications, past family history, past medical history, past social history, past surgical history and problem list. Problem list updated.  Objective  Blood pressure 120/80, weight 155 lb (70.3 kg), last menstrual period 08/11/2021 Pregravid weight 153 lb (69.4 kg) Total Weight Gain 2 lb (0.907 kg) Urinalysis: Urine Protein    Urine Glucose    Fetal Status:     Movement: Absent     General:  Alert, oriented and cooperative. Patient is in no acute distress.  Skin: Skin is warm and dry. No rash noted.   Cardiovascular: Normal heart rate noted  Respiratory: Normal respiratory effort, no problems with respiration noted  Abdomen: Soft, gravid, appropriate for gestational age. Pain/Pressure: Present     Pelvic:  no current vaginal discharge        Extremities: Normal range of motion.  Edema: None  Mental Status: Normal mood and affect. Normal behavior. Normal judgment and thought content.   Assessment   31y.o. [id number] 15w0dy  06/24/2022, by Ultrasound presenting for routine prenatal visit  Plan    fourth Problems (from 10/22/21 to present)    Problem Noted Resolved   History of VBAC 12/17/2021 by GlRod CanCNM No   Supervision of other normal pregnancy, antepartum 10/22/2021 by JoCleophas DunkerCMA No   Overview Addendum 11/19/2021  8:44 AM by FrImagene RichesCNM     Nursing Staff Provider  Office Location  [facility] Dating  EDD changes per dating scan  Language  English Anatomy [place]  Flu Vaccine   Genetic Screen  NIPS: xy  TDaP vaccine    Hgb A1C or  GTT Early : Third trimester :   Covid    LAB RESULTS   Rhogam   Blood Type   A+  Feeding Plan  Antibody  neg  Contraception  Rubella  immune  Circumcision  RPR   NR  Pediatrician   HBsAg   neg  Support Person  HIV  Neg  Prenatal Classes  Varicella immune    GBS  (For PCN allergy, check sensitivities)   BTL Consent     VBAC Consent  Pap      Hgb Electro      CF      SMA               Genital herpes 02/2020 by GlRod CanCNM No   Overview Signed 12/17/2021 11:40 AM by GlRod CanCNM    type 1 on culture          Preterm labor symptoms and general obstetric precautions including but not limited to vaginal bleeding, contractions, leaking of fluid and fetal movement were reviewed in detail with the patient. Please refer to After Visit Summary for other counseling recommendations.   Return in about 4 weeks (around 01/14/2022)  for rob.  Rod Can, CNM 12/17/2021 11:42 AM

## 2022-01-14 ENCOUNTER — Ambulatory Visit (INDEPENDENT_AMBULATORY_CARE_PROVIDER_SITE_OTHER): Payer: 59 | Admitting: Obstetrics

## 2022-01-14 VITALS — BP 122/70 | Wt 161.0 lb

## 2022-01-14 DIAGNOSIS — Z3480 Encounter for supervision of other normal pregnancy, unspecified trimester: Secondary | ICD-10-CM

## 2022-01-14 DIAGNOSIS — Z3A17 17 weeks gestation of pregnancy: Secondary | ICD-10-CM

## 2022-01-14 NOTE — Progress Notes (Signed)
No vb. No lof.  

## 2022-01-14 NOTE — Progress Notes (Signed)
Routine Prenatal Care Visit  Subjective  Cathy Benjamin is a 31 y.o. [phone] at 78w0dbeing seen today for ongoing prenatal care.  She is currently monitored for the following issues for this low-risk pregnancy and has Supervision of other normal pregnancy, antepartum; History of VBAC; and Genital herpes on their problem list.  ----------------------------------------------------------------------------------- Patient reports no complaints.  She is naming her son Cathy Benjamin Contractions: Not present. Vag. Bleeding: None.   . Leaking Fluid denies.  ----------------------------------------------------------------------------------- The following portions of the patient's history were reviewed and updated as appropriate: allergies, current medications, past family history, past medical history, past social history, past surgical history and problem list. Problem list updated.  Objective  Blood pressure 122/70, weight 161 lb (73 kg), last menstrual period 08/11/2021, unknown if currently breastfeeding. Pregravid weight 153 lb (69.4 kg) Total Weight Gain 8 lb (3.629 kg) Urinalysis: Urine Protein    Urine Glucose    Fetal Status:           General:  Alert, oriented and cooperative. Patient is in no acute distress.  Skin: Skin is warm and dry. No rash noted.   Cardiovascular: Normal heart rate noted  Respiratory: Normal respiratory effort, no problems with respiration noted  Abdomen: Soft, gravid, appropriate for gestational age. Pain/Pressure: Absent     Pelvic:  Cervical exam deferred        Extremities: Normal range of motion.     Mental Status: Normal mood and affect. Normal behavior. Normal judgment and thought content.   Assessment   31y.o. [id number] 182w0dy  06/24/2022, by Ultrasound presenting for routine prenatal visit  Plan   fourth Problems (from 10/22/21 to present)    Problem Noted Resolved   History of VBAC 12/17/2021 by GlRod CanCNM No   Supervision of other normal  pregnancy, antepartum 10/22/2021 by JoCleophas DunkerCMA No   Overview Addendum 11/19/2021  8:44 AM by FrImagene RichesCNM     Nursing Staff Provider  Office Location  [facility] Dating  EDD changes per dating scan  Language  English Anatomy [place]  Flu Vaccine   Genetic Screen  NIPS: xy  TDaP vaccine    Hgb A1C or  GTT Early : Third trimester :   Covid    LAB RESULTS   Rhogam   Blood Type   A+  Feeding Plan  Antibody  neg  Contraception  Rubella  immune  Circumcision  RPR   NR  Pediatrician   HBsAg   neg  Support Person  HIV  Neg  Prenatal Classes  Varicella immune    GBS  (For PCN allergy, check sensitivities)   BTL Consent     VBAC Consent  Pap      Hgb Electro      CF      SMA               Genital herpes 02/2020 by GlRod CanCNM No   Overview Signed 12/17/2021 11:40 AM by GlRod CanCNM    type 1 on culture          Preterm labor symptoms and general obstetric precautions including but not limited to vaginal bleeding, contractions, leaking of fluid and fetal movement were reviewed in detail with the patient. Please refer to After Visit Summary for other counseling recommendations.  Her anatomy scan is ordered.  Return in about 4 weeks (around 02/11/2022) for return OB.  MaImagene RichesCNM  01/14/2022 11:57 AM

## 2022-01-21 ENCOUNTER — Ambulatory Visit
Admission: RE | Admit: 2022-01-21 | Discharge: 2022-01-21 | Disposition: A | Discharge status: Home / Self Care | Payer: 59 | Source: Ambulatory Visit | Attending: Obstetrics | Admitting: Obstetrics

## 2022-01-21 DIAGNOSIS — O321XX0 Maternal care for breech presentation, not applicable or unspecified: Secondary | ICD-10-CM | POA: Diagnosis not present

## 2022-01-21 DIAGNOSIS — Z3A18 18 weeks gestation of pregnancy: Secondary | ICD-10-CM | POA: Insufficient documentation

## 2022-01-21 DIAGNOSIS — Z3480 Encounter for supervision of other normal pregnancy, unspecified trimester: Secondary | ICD-10-CM | POA: Diagnosis present

## 2022-01-28 ENCOUNTER — Other Ambulatory Visit: Payer: Self-pay | Admitting: Obstetrics

## 2022-01-28 DIAGNOSIS — Z3480 Encounter for supervision of other normal pregnancy, unspecified trimester: Secondary | ICD-10-CM

## 2022-02-06 ENCOUNTER — Ambulatory Visit
Admission: RE | Admit: 2022-02-06 | Discharge: 2022-02-06 | Disposition: A | Discharge status: Home / Self Care | Payer: 59 | Source: Ambulatory Visit | Attending: Obstetrics | Admitting: Obstetrics

## 2022-02-06 DIAGNOSIS — Z3482 Encounter for supervision of other normal pregnancy, second trimester: Secondary | ICD-10-CM | POA: Diagnosis not present

## 2022-02-06 DIAGNOSIS — Z3A20 20 weeks gestation of pregnancy: Secondary | ICD-10-CM | POA: Insufficient documentation

## 2022-02-06 DIAGNOSIS — Z3480 Encounter for supervision of other normal pregnancy, unspecified trimester: Secondary | ICD-10-CM | POA: Diagnosis present

## 2022-02-11 ENCOUNTER — Ambulatory Visit (INDEPENDENT_AMBULATORY_CARE_PROVIDER_SITE_OTHER): Payer: 59 | Admitting: Licensed Practical Nurse

## 2022-02-11 VITALS — BP 122/70 | Wt 166.0 lb

## 2022-02-11 DIAGNOSIS — Z3480 Encounter for supervision of other normal pregnancy, unspecified trimester: Secondary | ICD-10-CM

## 2022-02-11 DIAGNOSIS — Z3A21 21 weeks gestation of pregnancy: Secondary | ICD-10-CM

## 2022-02-11 LAB — POCT URINALYSIS DIPSTICK
Glucose, UA: NEGATIVE
Protein, UA: NEGATIVE

## 2022-02-11 NOTE — Progress Notes (Signed)
Routine Prenatal Care Visit  Subjective  Cathy Benjamin is a 31 y.o. [phone] at 24w0dbeing seen today for ongoing prenatal care.  She is currently monitored for the following issues for this high-risk pregnancy and has Supervision of other normal pregnancy, antepartum; History of VBAC; and Genital herpes on their problem list.  ----------------------------------------------------------------------------------- Patient reports  lower abdominal/pelvic pain after standing for long amounts of time .  Is on her feet all day on Monday's and Tuesday's.  Rec support belt Mood has been a "roller coaster" with manages home life and pregnancy, denies concern for depression.  Had some PPD with oldest child, with her second she was able to identify it and "asked for help". Plans to breastfeed, was not able to produce much with her first, did not BF her second.  Desires BTL  Contractions: Not present. Vag. Bleeding: None.  Movement: Present. Leaking Fluid denies.  ----------------------------------------------------------------------------------- The following portions of the patient's history were reviewed and updated as appropriate: allergies, current medications, past family history, past medical history, past social history, past surgical history and problem list. Problem list updated.  Objective  Blood pressure 122/70, weight 166 lb (75.3 kg), last menstrual period 08/11/2021, unknown if currently breastfeeding. Pregravid weight 153 lb (69.4 kg) Total Weight Gain 13 lb (5.897 kg) Urinalysis: Urine Protein    Urine Glucose    Fetal Status: Fetal Heart Rate (bpm): 140   Movement: Present     General:  Alert, oriented and cooperative. Patient is in no acute distress.  Skin: Skin is warm and dry. No rash noted.   Cardiovascular: Normal heart rate noted  Respiratory: Normal respiratory effort, no problems with respiration noted  Abdomen: Soft, gravid, appropriate for gestational age. Pain/Pressure:  Present     Pelvic:  Cervical exam deferred        Extremities: Normal range of motion.  Edema: None  Mental Status: Normal mood and affect. Normal behavior. Normal judgment and thought content.   Assessment   31y.o. [id number] 240w0dy  06/24/2022, by Ultrasound presenting for routine prenatal visit  Plan   fourth Problems (from 10/22/21 to present)     Problem Noted Resolved   History of VBAC 12/17/2021 by GlRod CanCNM No   Supervision of other normal pregnancy, antepartum 10/22/2021 by JoCleophas DunkerCMA No   Overview Addendum 11/19/2021  8:44 AM by FrImagene RichesCNM     Nursing Staff Provider  Office Location  [facility] Dating  EDD changes per dating scan  Language  English Anatomy [place]  Flu Vaccine   Genetic Screen  NIPS: xy  TDaP vaccine    Hgb A1C or  GTT Early : Third trimester :   Covid    LAB RESULTS   Rhogam   Blood Type   A+  Feeding Plan  Antibody  neg  Contraception  Rubella  immune  Circumcision  RPR   NR  Pediatrician   HBsAg   neg  Support Person  HIV  Neg  Prenatal Classes  Varicella immune    GBS  (For PCN allergy, check sensitivities)   BTL Consent     VBAC Consent  Pap      Hgb Electro      CF      SMA               Genital herpes 02/2020 by GlRod CanCNM No   Overview Signed 12/17/2021 11:40 AM by GlRod CanCNM  type 1 on culture           Preterm labor symptoms and general obstetric precautions including but not limited to vaginal bleeding, contractions, leaking of fluid and fetal movement were reviewed in detail with the patient. Please refer to After Visit Summary for other counseling recommendations.   Return in about 4 weeks (around 03/11/2022) for ROB.  Will need to see MD for VBAC consent  Roberto Scales, [facility]  02/11/22  8:32 AM

## 2022-03-11 ENCOUNTER — Encounter: Payer: 59 | Admitting: Licensed Practical Nurse

## 2022-03-23 ENCOUNTER — Ambulatory Visit (INDEPENDENT_AMBULATORY_CARE_PROVIDER_SITE_OTHER): Payer: 59 | Admitting: Advanced Practice Midwife

## 2022-03-23 ENCOUNTER — Encounter: Payer: Self-pay | Admitting: Advanced Practice Midwife

## 2022-03-23 VITALS — BP 126/84 | HR 105 | Wt 171.0 lb

## 2022-03-23 DIAGNOSIS — Z3A26 26 weeks gestation of pregnancy: Secondary | ICD-10-CM

## 2022-03-23 DIAGNOSIS — Z369 Encounter for antenatal screening, unspecified: Secondary | ICD-10-CM

## 2022-03-23 DIAGNOSIS — Z130 Encounter for screening for diseases of the blood and blood-forming organs and certain disorders involving the immune mechanism: Secondary | ICD-10-CM

## 2022-03-23 DIAGNOSIS — Z131 Encounter for screening for diabetes mellitus: Secondary | ICD-10-CM

## 2022-03-23 DIAGNOSIS — Z3482 Encounter for supervision of other normal pregnancy, second trimester: Secondary | ICD-10-CM

## 2022-03-23 DIAGNOSIS — Z113 Encounter for screening for infections with a predominantly sexual mode of transmission: Secondary | ICD-10-CM

## 2022-03-23 DIAGNOSIS — Z3480 Encounter for supervision of other normal pregnancy, unspecified trimester: Secondary | ICD-10-CM

## 2022-03-23 LAB — POCT URINALYSIS DIPSTICK OB
Bilirubin, UA: NEGATIVE
Blood, UA: NEGATIVE
Glucose, UA: NEGATIVE
Ketones, UA: NEGATIVE
Leukocytes, UA: NEGATIVE
Nitrite, UA: NEGATIVE
POC,PROTEIN,UA: NEGATIVE
Spec Grav, UA: 1.02 (ref 1.010–1.025)
Urobilinogen, UA: 0.2 E.U./dL
pH, UA: 7 (ref 5.0–8.0)

## 2022-03-23 NOTE — Progress Notes (Signed)
Routine Prenatal Care Visit  Subjective  Cathy Benjamin is a 31 y.o. [phone] at 67w5dbeing seen today for ongoing prenatal care.  She is currently monitored for the following issues for this low-risk pregnancy and has Supervision of other normal pregnancy, antepartum; History of VBAC; and Genital herpes on their problem list.  ----------------------------------------------------------------------------------- Patient reports mid pelvic pain and pressure. 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   Contractions: Not present. Vag. Bleeding: None.  Movement: Present. Leaking Fluid denies.  ----------------------------------------------------------------------------------- The following portions of the patient's history were reviewed and updated as appropriate: allergies, current medications, past family history, past medical history, past social history, past surgical history and problem list. Problem list updated.  Objective  Blood pressure 126/84, pulse (!) 105, weight 171 lb (77.6 kg), last menstrual period 08/11/2021 Pregravid weight 153 lb (69.4 kg) Total Weight Gain 18 lb (8.165 kg) Urinalysis: Urine Protein Negative  Urine Glucose Negative  Fetal Status: Fetal Heart Rate (bpm): 145 Fundal Height: 27 cm Movement: Present     General:  Alert, oriented and cooperative. Patient is in no acute distress.  Skin: Skin is warm and dry. No rash noted.   Cardiovascular: Normal heart rate noted  Respiratory: Normal respiratory effort, no problems with respiration noted  Abdomen: Soft, gravid, appropriate for gestational age. Pain/Pressure: Present     Pelvic:  Cervical exam deferred        Extremities: Normal range of motion.  Edema: None  Mental Status: Normal mood and affect. Normal behavior. Normal judgment and thought content.   Assessment   31y.o. [id number] 2107w5dy  06/24/2022, by Ultrasound presenting for routine prenatal visit  Plan   fourth Problems (from 10/22/21  to present)    Problem Noted Resolved   History of VBAC 12/17/2021 by GlRod CanCNM No   Supervision of other normal pregnancy, antepartum 10/22/2021 by JoCleophas DunkerCMA No   Overview Addendum 02/11/2022  8:31 AM by DoAllen DerryCNRockvilletaff Provider  Office Location  [facility] Dating  EDD changes per dating scan  Language  English Anatomy [place] with normal fu  Flu Vaccine   Genetic Screen  NIPS: xy  TDaP vaccine    Hgb A1C or  GTT Early : Third trimester :   Covid    LAB RESULTS   Rhogam   Blood Type   A+  Feeding Plan Breast Antibody  neg  Contraception BTL, vasectomy  Rubella  immune  Circumcision  RPR   NR  Pediatrician   HBsAg   neg  Support Person Husband  HIV  Neg  Prenatal Classes  Varicella immune    GBS  (For PCN allergy, check sensitivities)   BTL Consent     VBAC Consent  Pap  NILM 11/05/21    Hgb Electro      CF      SMA               Genital herpes 02/2020 by GlRod CanCNM No   Overview Signed 12/17/2021 11:40 AM by GlRod CanCNM    type 1 on culture          Preterm labor symptoms and general obstetric precautions including but not limited to vaginal bleeding, contractions, leaking of fluid and fetal movement were reviewed in detail with the patient. Please refer to After Visit Summary for other counseling recommendations.   Return in about 2 weeks (around 04/06/2022) for 28 wk labs and  rob.  Rod Can, CNM 03/23/2022 2:24 PM

## 2022-03-30 ENCOUNTER — Other Ambulatory Visit: Payer: Self-pay

## 2022-03-30 ENCOUNTER — Observation Stay
Admission: EM | Admit: 2022-03-30 | Discharge: 2022-03-30 | Disposition: A | Discharge status: Home / Self Care | Payer: 59 | Attending: Obstetrics and Gynecology | Admitting: Obstetrics and Gynecology

## 2022-03-30 ENCOUNTER — Encounter: Payer: Self-pay | Admitting: Obstetrics and Gynecology

## 2022-03-30 DIAGNOSIS — O4192X0 Disorder of amniotic fluid and membranes, unspecified, second trimester, not applicable or unspecified: Principal | ICD-10-CM | POA: Insufficient documentation

## 2022-03-30 DIAGNOSIS — A6000 Herpesviral infection of urogenital system, unspecified: Secondary | ICD-10-CM

## 2022-03-30 DIAGNOSIS — O26892 Other specified pregnancy related conditions, second trimester: Secondary | ICD-10-CM | POA: Diagnosis not present

## 2022-03-30 DIAGNOSIS — Z98891 History of uterine scar from previous surgery: Secondary | ICD-10-CM | POA: Insufficient documentation

## 2022-03-30 DIAGNOSIS — Z3A27 27 weeks gestation of pregnancy: Secondary | ICD-10-CM | POA: Diagnosis not present

## 2022-03-30 DIAGNOSIS — Z3480 Encounter for supervision of other normal pregnancy, unspecified trimester: Secondary | ICD-10-CM

## 2022-03-30 DIAGNOSIS — N898 Other specified noninflammatory disorders of vagina: Secondary | ICD-10-CM | POA: Diagnosis not present

## 2022-03-30 LAB — RUPTURE OF MEMBRANE (ROM)PLUS: Rom Plus: NEGATIVE

## 2022-03-30 NOTE — OB Triage Note (Signed)
OB Triage Note  Patient ID: Cathy Benjamin MRN: [id number] DOB/AGE: May 27, 1990 31 y.o.  Admit date: 03/30/2022 Admitting provider: Maggie Font, CNM Discharge date: 03/30/2022   Admission Diagnoses:  1) intrauterine pregnancy at [redacted]w[redacted]d 2) concern for rupture of membranes  Discharge Diagnoses:  Principal Problem:   [redacted] weeks gestation of pregnancy    History of Present Illness: The patient is a 31y.o. female [phone] 279w5dho presents with concern for rupture of membranes. Was using the bathroom, than sat down for awhile. When standing back up she had a large gush of fluid. She was unsure what is was so put on a panty liner. The liner became saturated. It did not smell like urine, appeared to be clear. While moving around she has felt continued LOF although no large gushes, no leaking since coming to triage. Felt the gush at 1630. Denies VB, HA, painful contractions. Reports good FM. Has had two previous births which were both term.   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   Current Outpatient Medications on File Prior to Encounter  Medication Sig Dispense Refill   Prenatal Vit-Fe Fumarate-FA (PRENATAL VITAMIN PO) Take by mouth.      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[employer] [place] Use   Smoking status: Former   Smokeless tobacco: Never  VaScientific laboratory technicianse: Former  Substance and Sexual Activity   Alcohol use: Not Currently    Comment: socially   Drug use: No   Sexual activity: Yes    Birth control/protection: None    Comment: Mirena  Other Topics Concern   Not on file  Social History Narrative   Not on file   Social Determinants of Health   Financial Resource Strain: Low Risk  (10/22/2021)   Overall Financial Resource Strain (CARDIA)    Difficulty of Paying Living Expenses: Not hard at all  Food Insecurity: No Food Insecurity (10/22/2021)   Hunger Vital Sign    Worried About Running Out of Food in the Last Year: Never true    Ra[place] the Last Year: Never true  Transportation Needs: No Transportation Needs (10/22/2021)   PRAPARE - TrHydrologistMedical): No    Lack of Transportation (Non-Medical): No  Physical Activity: Unknown (10/22/2021)   Exercise Vital Sign    Days of Exercise per Week: Not on file    Minutes of Exercise per Session: 30 min  Stress: No Stress Concern Present (10/22/2021)   [place]  Feeling of Stress : Not at all  Social Connections: Moderately Integrated (10/22/2021)  Social Licensed conveyancer [NHANES]    Frequency of Communication with Friends and Family: More than three times a week    Frequency of Social Gatherings with Friends and Family: More than three times a week    Attends Religious Services: 1 to 4 times per year    Active Member of [employer] or Organizations: No    Attends Archivist Meetings: Never    Marital Status: Married  Human resources officer Violence: Not At Risk (10/22/2021)   Humiliation, Afraid, Rape, and Kick questionnaire    Fear of Current or Ex-Partner: No    Emotionally Abused: No    Physically Abused: No    Sexually  Abused: No    Family History  Problem Relation Age of Onset   Testicular cancer Father        29s     ROS  Negative except what is stated in HPI  Physical Exam: BP 121/70 (BP Location: Right Arm)   Pulse 89   Temp 97.6 F (36.4 C) (Oral)   Resp 16   Ht '5\' 1"'$  (1.549 m)   Wt 77.6 kg   LMP 08/11/2021   BMI 32.31 kg/m   OBGyn Exam SVE deferred  FHT 130, mod variability, pos accels, no decels Toco [employer] q3-72mn, patient not feeling them   Significant Findings/ Diagnostic Studies:  Negative ferning ROM plus negative    Discharge Instructions     Discharge activity:  No Restrictions   Complete by: As directed    Discharge diet:  No restrictions   Complete by: As directed    Notify physician for a general feeling that "something is not right"   Complete by: As directed    Notify physician for increase or change in vaginal discharge   Complete by: As directed    Notify physician for intestinal cramps, with or without diarrhea, sometimes described as "gas pain"   Complete by: As directed    Notify physician for leaking of fluid   Complete by: As directed    Notify physician for low, dull backache, unrelieved by heat or Tylenol   Complete by: As directed    Notify physician for menstrual like cramps   Complete by: As directed    Notify physician for pelvic pressure   Complete by: As directed    Notify physician for uterine contractions.  These may be painless and feel like the uterus is tightening or the baby is  "balling up"   Complete by: As directed    Notify physician for vaginal bleeding   Complete by: As directed    PRETERM LABOR:  Includes any of the follwing symptoms that occur between 20 - [redacted] weeks gestation.  If these symptoms are not stopped, preterm labor can result in preterm delivery, placing your baby at risk   Complete by: As directed       Allergies as of 03/30/2022   Not on File      Medication List     TAKE these medications    PRENATAL  VITAMIN PO Take by mouth.       Assessment: 31y/o G4P2 at 27wk5d Membranes intact  Plan Reassurance provided Preterm labor precautions discussed Discharged home, please f/u at next ROB    Total time spent taking care of this patient: 30 minutes  Signed: MMaggie FontCNM, FNP 03/30/2022, 7:28 PM

## 2022-03-30 NOTE — OB Triage Note (Signed)
31 y.o [id number] who is 9w5dand presents to L&D triage w/ c/o possible ROM. She reports that around 1615 while at work she felt a small gush/leaking sensation when in the bathroom and she noted clear fluid in her underwear. Shortly after, she felt some more leaking. Pt admits fetal movement and denies vaginal bleeding, contractions, nausea/vomiting. She reports having pelvic pain that's been ongoing and rates it as a 6/10. Pt denies pressure. Monitors applied and assessing. Rom+ sent to lab and fern test was negative. Plan includes to monitor and MRoque Cashwill assess.

## 2022-03-30 NOTE — OB Triage Note (Signed)
Patient ROM plus results are negative. Provider reviewed results and will discharge patient. Patient discharge information reviewed, patient given opportunity to ask questions, and understands reasons to return to hospital. Discharge papers given to patient. Vitals are stable. Fht 145 at time of discharge with positive fetal movement.

## 2022-04-07 ENCOUNTER — Ambulatory Visit (INDEPENDENT_AMBULATORY_CARE_PROVIDER_SITE_OTHER): Payer: 59 | Admitting: Obstetrics and Gynecology

## 2022-04-07 ENCOUNTER — Other Ambulatory Visit: Payer: 59

## 2022-04-07 ENCOUNTER — Encounter: Payer: Self-pay | Admitting: Obstetrics and Gynecology

## 2022-04-07 VITALS — BP 115/75 | HR 92 | Wt 182.7 lb

## 2022-04-07 DIAGNOSIS — Z3483 Encounter for supervision of other normal pregnancy, third trimester: Secondary | ICD-10-CM

## 2022-04-07 DIAGNOSIS — Z98891 History of uterine scar from previous surgery: Secondary | ICD-10-CM

## 2022-04-07 DIAGNOSIS — Z3480 Encounter for supervision of other normal pregnancy, unspecified trimester: Secondary | ICD-10-CM

## 2022-04-07 DIAGNOSIS — Z131 Encounter for screening for diabetes mellitus: Secondary | ICD-10-CM

## 2022-04-07 DIAGNOSIS — Z130 Encounter for screening for diseases of the blood and blood-forming organs and certain disorders involving the immune mechanism: Secondary | ICD-10-CM

## 2022-04-07 DIAGNOSIS — Z113 Encounter for screening for infections with a predominantly sexual mode of transmission: Secondary | ICD-10-CM

## 2022-04-07 DIAGNOSIS — Z369 Encounter for antenatal screening, unspecified: Secondary | ICD-10-CM

## 2022-04-07 DIAGNOSIS — Z3009 Encounter for other general counseling and advice on contraception: Secondary | ICD-10-CM

## 2022-04-07 DIAGNOSIS — Z3A28 28 weeks gestation of pregnancy: Secondary | ICD-10-CM

## 2022-04-07 DIAGNOSIS — Z23 Encounter for immunization: Secondary | ICD-10-CM

## 2022-04-07 NOTE — Progress Notes (Signed)
ROB [redacted]w[redacted]d She is doing well, she has a spot or mole on her face that she has concerns about. She has good fetal movement. Declines flu vaccine today.

## 2022-04-07 NOTE — Progress Notes (Signed)
ROB: Patient doing well, no issues today.  Does note a mole on her face, however does not appear to be concerning at this time.  Desires to discuss BTL. Notes that her partner is also going to have a vasectomy. Discussed risks/benefits of procedure as well as alternatives. Patient firm in her decision.  Will plan for PP tubal. For 28 week labs today.  Plans to breastfeed. For Tdap today, signed blood consent. Declined flu vaccine. RTC in 2 weeks.

## 2022-04-08 ENCOUNTER — Encounter: Payer: Self-pay | Admitting: Obstetrics and Gynecology

## 2022-04-08 LAB — 28 WEEK RH+PANEL
Basophils Absolute: 0 10*3/uL (ref 0.0–0.2)
Basos: 0 %
EOS (ABSOLUTE): 0.1 10*3/uL (ref 0.0–0.4)
Eos: 1 %
Gestational Diabetes Screen: 98 mg/dL (ref 70–139)
HIV Screen 4th Generation wRfx: NONREACTIVE
Hematocrit: 34.9 % (ref 34.0–46.6)
Hemoglobin: 11.5 g/dL (ref 11.1–15.9)
Immature Grans (Abs): 0 10*3/uL (ref 0.0–0.1)
Immature Granulocytes: 0 %
Lymphocytes Absolute: 2 10*3/uL (ref 0.7–3.1)
Lymphs: 28 %
MCH: 30.2 pg (ref 26.6–33.0)
MCHC: 33 g/dL (ref 31.5–35.7)
MCV: 92 fL (ref 79–97)
Monocytes Absolute: 0.4 10*3/uL (ref 0.1–0.9)
Monocytes: 5 %
Neutrophils Absolute: 4.7 10*3/uL (ref 1.4–7.0)
Neutrophils: 66 %
Platelets: 183 10*3/uL (ref 150–450)
RBC: 3.81 x10E6/uL (ref 3.77–5.28)
RDW: 12.2 % (ref 11.7–15.4)
RPR Ser Ql: NONREACTIVE
WBC: 7.2 10*3/uL (ref 3.4–10.8)

## 2022-04-23 ENCOUNTER — Ambulatory Visit (INDEPENDENT_AMBULATORY_CARE_PROVIDER_SITE_OTHER): Payer: 59 | Admitting: Obstetrics and Gynecology

## 2022-04-23 ENCOUNTER — Encounter: Payer: Self-pay | Admitting: Obstetrics and Gynecology

## 2022-04-23 VITALS — BP 121/82 | HR 98 | Wt 190.4 lb

## 2022-04-23 DIAGNOSIS — Z3483 Encounter for supervision of other normal pregnancy, third trimester: Secondary | ICD-10-CM

## 2022-04-23 DIAGNOSIS — Z3480 Encounter for supervision of other normal pregnancy, unspecified trimester: Secondary | ICD-10-CM

## 2022-04-23 DIAGNOSIS — Z3A31 31 weeks gestation of pregnancy: Secondary | ICD-10-CM

## 2022-04-23 LAB — POCT URINALYSIS DIPSTICK OB
Bilirubin, UA: NEGATIVE
Glucose, UA: NEGATIVE
Ketones, UA: NEGATIVE
Leukocytes, UA: NEGATIVE
Nitrite, UA: NEGATIVE
POC,PROTEIN,UA: NEGATIVE
Spec Grav, UA: 1.015 (ref 1.010–1.025)
Urobilinogen, UA: 0.2 E.U./dL
pH, UA: 6 (ref 5.0–8.0)

## 2022-04-23 NOTE — Progress Notes (Signed)
ROB: Has some pelvic pressure-UA normal.  Bilateral lower extremity edema, worse in the morning but patient is sleeping in the recliner.  We have discussed this.  She will try some different things to elevate her feet more as well as increased clear fluids and decrease salt intake.  Denies contractions.  Reports daily fetal movement.  Reaffirmed her desire for VBAC.  Baby breech today.

## 2022-04-23 NOTE — Progress Notes (Signed)
ROB. Patient states daily fetal movement with pelvic pressure. She states swelling in both feet in the morning and while working. Patient states no questions or concerns at this time.

## 2022-05-05 ENCOUNTER — Ambulatory Visit (INDEPENDENT_AMBULATORY_CARE_PROVIDER_SITE_OTHER): Payer: 59 | Admitting: Obstetrics and Gynecology

## 2022-05-05 ENCOUNTER — Encounter: Payer: Self-pay | Admitting: Obstetrics and Gynecology

## 2022-05-05 VITALS — BP 114/79 | HR 88 | Wt 189.0 lb

## 2022-05-05 DIAGNOSIS — Z3A32 32 weeks gestation of pregnancy: Secondary | ICD-10-CM

## 2022-05-05 DIAGNOSIS — Z3480 Encounter for supervision of other normal pregnancy, unspecified trimester: Secondary | ICD-10-CM

## 2022-05-05 LAB — POCT URINALYSIS DIPSTICK OB
Bilirubin, UA: NEGATIVE
Blood, UA: NEGATIVE
Glucose, UA: NEGATIVE
Ketones, UA: NEGATIVE
Leukocytes, UA: NEGATIVE
Nitrite, UA: NEGATIVE
POC,PROTEIN,UA: NEGATIVE
Spec Grav, UA: 1.02 (ref 1.010–1.025)
Urobilinogen, UA: 0.2 E.U./dL
pH, UA: 6 (ref 5.0–8.0)

## 2022-05-05 NOTE — Progress Notes (Signed)
ROB: Feeling daily fetal movement.  Does occasionally feel left-sided sharp stabbing pain for brief times when moving from 1 position to another or when bending over.  Sounds consistent with round ligament pain.  Patient still planning on VBAC.  Baby vertex today-continue to confirm position at each visit.

## 2022-05-05 NOTE — Progress Notes (Signed)
ROB. Patient states she only has a sharp pain when bending over but no other concern.

## 2022-05-20 ENCOUNTER — Ambulatory Visit (INDEPENDENT_AMBULATORY_CARE_PROVIDER_SITE_OTHER): Payer: 59 | Admitting: Certified Nurse Midwife

## 2022-05-20 VITALS — BP 123/80 | HR 96 | Wt 194.6 lb

## 2022-05-20 DIAGNOSIS — Z3480 Encounter for supervision of other normal pregnancy, unspecified trimester: Secondary | ICD-10-CM

## 2022-05-20 LAB — POCT URINALYSIS DIPSTICK OB
Bilirubin, UA: NEGATIVE
Glucose, UA: NEGATIVE
Ketones, UA: NEGATIVE
Leukocytes, UA: NEGATIVE
Nitrite, UA: NEGATIVE
Spec Grav, UA: 1.015 (ref 1.010–1.025)
Urobilinogen, UA: 0.2 E.U./dL
pH, UA: 6.5 (ref 5.0–8.0)

## 2022-05-20 MED ORDER — VALACYCLOVIR HCL 1 G PO TABS: 1000.0000 mg | ORAL_TABLET | Freq: Two times a day (BID) | ORAL | 1 refills | Status: DC

## 2022-05-20 NOTE — Patient Instructions (Signed)

## 2022-05-20 NOTE — Progress Notes (Signed)
ROB doing well, feeling good movement. Discussed gbs testing next visit. Discussed valtrex daily at 36 wks , she verbalizes and agrees. Orders placed. Pt states baby was breech on leopold's last visit. Feels vertex today. Discussed spinning babies.   Follow up ROB 1 wk.

## 2022-05-25 NOTE — L&D Delivery Note (Signed)
Date of delivery: 06/19/2022 Estimated Date of Delivery: 06/24/22 Patient's last menstrual period was 08/11/2021. EGA: [redacted]w[redacted]d Delivery Note At 11:05 AM a viable female was delivered via Vaginal, Spontaneous  Presentation: Right Occiput Anterior APGAR: 6, 9;    Weight: 9#1oz, 4120 g Placenta status: Spontaneous, Intact.  Cord: 3 vessels with the following complications: None.  Cord pH: NA  Called to see patient.  Mom pushed well to deliver a viable female infant.  The head followed by tight shoulders, McRoberts and supra pubic pressure resolved, delivery of anterior shoulder and the rest of the body.  Loose nuchal cord noted and reduced on the perineum.  Baby to mom's chest.  Cord clamped and cut after 4 minute delay.  No cord blood obtained.  Placenta delivered spontaneously, intact, with a 3-vessel cord.  All counts correct.  Hemostasis obtained with IV pitocin and fundal massage.    Anesthesia: Epidural Episiotomy: None Lacerations: None Suture Repair:  NA Est. Blood Loss (mL): 400  Mom to postpartum.  Baby to Couplet care / Skin to Skin.   JRod Can CNM 06/19/2022, 12:12 PM

## 2022-05-27 ENCOUNTER — Other Ambulatory Visit (HOSPITAL_COMMUNITY)
Admission: RE | Admit: 2022-05-27 | Discharge: 2022-05-27 | Disposition: A | Discharge status: Home / Self Care | Payer: 59 | Source: Ambulatory Visit | Attending: Obstetrics | Admitting: Obstetrics

## 2022-05-27 ENCOUNTER — Ambulatory Visit (INDEPENDENT_AMBULATORY_CARE_PROVIDER_SITE_OTHER): Payer: 59 | Admitting: Obstetrics

## 2022-05-27 ENCOUNTER — Encounter: Payer: Self-pay | Admitting: Obstetrics

## 2022-05-27 VITALS — BP 121/79 | HR 109 | Wt 195.0 lb

## 2022-05-27 DIAGNOSIS — Z3483 Encounter for supervision of other normal pregnancy, third trimester: Secondary | ICD-10-CM

## 2022-05-27 DIAGNOSIS — Z3A36 36 weeks gestation of pregnancy: Secondary | ICD-10-CM

## 2022-05-27 LAB — POCT URINALYSIS DIPSTICK OB
Bilirubin, UA: NEGATIVE
Blood, UA: NEGATIVE
Glucose, UA: NEGATIVE
Ketones, UA: NEGATIVE
Leukocytes, UA: NEGATIVE
Nitrite, UA: NEGATIVE
POC,PROTEIN,UA: NEGATIVE
Spec Grav, UA: 1.02 (ref 1.010–1.025)
Urobilinogen, UA: 0.2 E.U./dL
pH, UA: 7 (ref 5.0–8.0)

## 2022-05-27 NOTE — Addendum Note (Signed)
Addended by: Landis Gandy on: 05/27/2022 02:47 PM   Modules accepted: Orders

## 2022-05-27 NOTE — Addendum Note (Signed)
Addended by: Landis Gandy on: 05/27/2022 04:51 PM   Modules accepted: Orders

## 2022-05-27 NOTE — Progress Notes (Signed)
ROB at [redacted]w[redacted]d Very active baby. Kimbria denies ctx, LOF, and vaginal bleeding. She is waking up every morning with excruciating back pain. It eventually resolves with movement as the day progresses. She has tried Tylenol and I[employer] Data Corporationpatches with minimal relief. Recommend TENS unit, heating pad, yoga stretching, Miles circuit, sleeping in exaggerated Sims position. MCoca-Colagiven. Baby feels LOP by Leopolds. Makinzy is picking up Valtrex rx today. GBS and GC/chlamydia swabs self-collected. S/s of labor and when to go the hospital reviewed. RTC in 1 weeks.  MLloyd Huger CNM

## 2022-05-29 LAB — CERVICOVAGINAL ANCILLARY ONLY
Chlamydia: NEGATIVE
Comment: NEGATIVE
Comment: NORMAL
Neisseria Gonorrhea: NEGATIVE

## 2022-05-31 LAB — CULTURE, BETA STREP (GROUP B ONLY): Strep Gp B Culture: NEGATIVE

## 2022-06-03 ENCOUNTER — Ambulatory Visit (INDEPENDENT_AMBULATORY_CARE_PROVIDER_SITE_OTHER): Payer: 59 | Admitting: Licensed Practical Nurse

## 2022-06-03 ENCOUNTER — Encounter: Payer: Self-pay | Admitting: Licensed Practical Nurse

## 2022-06-03 VITALS — BP 130/86 | HR 105 | Wt 192.7 lb

## 2022-06-03 DIAGNOSIS — Z3483 Encounter for supervision of other normal pregnancy, third trimester: Secondary | ICD-10-CM

## 2022-06-03 DIAGNOSIS — Z3A37 37 weeks gestation of pregnancy: Secondary | ICD-10-CM

## 2022-06-03 DIAGNOSIS — O26843 Uterine size-date discrepancy, third trimester: Secondary | ICD-10-CM

## 2022-06-03 DIAGNOSIS — O34219 Maternal care for unspecified type scar from previous cesarean delivery: Secondary | ICD-10-CM

## 2022-06-03 LAB — POCT URINALYSIS DIPSTICK
Bilirubin, UA: NEGATIVE
Blood, UA: NEGATIVE
Glucose, UA: NEGATIVE
Ketones, UA: NEGATIVE
Leukocytes, UA: NEGATIVE
Nitrite, UA: NEGATIVE
Protein, UA: NEGATIVE
Spec Grav, UA: 1.01 (ref 1.010–1.025)
Urobilinogen, UA: 1 E.U./dL
pH, UA: 5.5 (ref 5.0–8.0)

## 2022-06-03 NOTE — Patient Instructions (Signed)
[email or web address]

## 2022-06-03 NOTE — Progress Notes (Signed)
Routine Prenatal Care Visit  Subjective  Cathy Benjamin is a 32 y.o. [phone] at 62w0dbeing seen today for ongoing prenatal care.  She is currently monitored for the following issues for this low-risk pregnancy and has Supervision of other normal pregnancy, antepartum; History of VBAC; Genital herpes; and [redacted] weeks gestation of pregnancy on their problem list.  ----------------------------------------------------------------------------------- Patient reports backache.  Does not consistently use support belt, comfort measures reviewed.  -concerned about fetal position.  Vertex by leopald's and VE, BSUS not working today, reviewed spinning babies.  -about 1cm fluctuant mass palpated on cervix, pt not aware of cervical cycst or polyp,  pt declines spec exam,   Contractions: Irritability. Vag. Bleeding: None.  Movement: Present. Leaking Fluid denies.  ----------------------------------------------------------------------------------- The following portions of the patient's history were reviewed and updated as appropriate: allergies, current medications, past family history, past medical history, past social history, past surgical history and problem list. Problem list updated.  Objective  Blood pressure 130/86, pulse (!) 105, weight 192 lb 11.2 oz (87.4 kg), last menstrual period 08/11/2021, unknown if currently breastfeeding. Pregravid weight 153 lb (69.4 kg) Total Weight Gain 39 lb 11.2 oz (18 kg) Urinalysis: Urine Protein    Urine Glucose    Fetal Status: Fetal Heart Rate (bpm): 135 Fundal Height: 40 cm Movement: Present  Presentation: Vertex  General:  Alert, oriented and cooperative. Patient is in no acute distress.  Skin: Skin is warm and dry. No rash noted.   Cardiovascular: Normal heart rate noted  Respiratory: Normal respiratory effort, no problems with respiration noted  Abdomen: Soft, gravid, appropriate for gestational age. Pain/Pressure: Present     Pelvic:  Cervical exam performed  Dilation: Closed Effacement (%): 50 Station: -3  Extremities: Normal range of motion.  Edema: Trace  Mental Status: Normal mood and affect. Normal behavior. Normal judgment and thought content.   Assessment   32y.o. [id number] 379w0dy  06/24/2022, by Ultrasound presenting for routine prenatal visit  Plan   fourth Problems (from 10/22/21 to present)     Problem Noted Resolved   History of VBAC 12/17/2021 by GlRod CanCNM No   Supervision of other normal pregnancy, antepartum 10/22/2021 by JoCleophas DunkerCMA No   Overview Addendum 04/07/2022 10:43 AM by BoChilton GreathouseCMBrunswicktaff Provider  Office Location  [facility] Dating  EDD changes per dating scan  Language  English Anatomy US[place]oreaNormal with normal fu  Flu Vaccine  Declined Genetic Screen  NIPS: xy  TDaP vaccine   04/07/2022 Hgb A1C or  GTT Early : Third trimester :   Covid    LAB RESULTS   Rhogam  N/A Blood Type A/Positive/-- (06/14 1122) A+  Feeding Plan Breast Antibody Negative (06/14 1122)neg  Contraception BTL, vasectomy  Rubella 1.33 (06/14 1122)immune  Circumcision Yes RPR Non Reactive (06/14 1122) NR  Pediatrician  Unsure HBsAg Negative (06/14 1122) neg  Support Person Husband (CBurt KnackHIV Non Reactive (06/14 1122)Neg  Prenatal Classes  Varicella immune    GBS  (For PCN allergy, check sensitivities)   BTL Consent     VBAC Consent  Pap  NILM 11/05/21    Hgb Electro      CF      SMA              Genital herpes 02/2020 by GlRod CanCNM No   Overview Signed 12/17/2021 11:40 AM by GlRod CanCNM    type 1 on culture  Term labor symptoms and general obstetric precautions including but not limited to vaginal bleeding, contractions, leaking of fluid and fetal movement were reviewed in detail with the patient. Please refer to After Visit Summary for other counseling recommendations.   Return in about 1 week (around 06/10/2022) for ROB.  Growth [place] ordered    Roberto Scales,  [facility]  06/03/22  12:06 PM

## 2022-06-04 ENCOUNTER — Ambulatory Visit (INDEPENDENT_AMBULATORY_CARE_PROVIDER_SITE_OTHER): Payer: 59

## 2022-06-04 DIAGNOSIS — Z3A37 37 weeks gestation of pregnancy: Secondary | ICD-10-CM | POA: Diagnosis not present

## 2022-06-04 DIAGNOSIS — O26843 Uterine size-date discrepancy, third trimester: Secondary | ICD-10-CM

## 2022-06-04 DIAGNOSIS — Z3483 Encounter for supervision of other normal pregnancy, third trimester: Secondary | ICD-10-CM

## 2022-06-10 ENCOUNTER — Ambulatory Visit (INDEPENDENT_AMBULATORY_CARE_PROVIDER_SITE_OTHER): Payer: 59 | Admitting: Obstetrics & Gynecology

## 2022-06-10 VITALS — BP 119/71 | HR 101 | Wt 193.0 lb

## 2022-06-10 DIAGNOSIS — A6000 Herpesviral infection of urogenital system, unspecified: Secondary | ICD-10-CM

## 2022-06-10 DIAGNOSIS — Z98891 History of uterine scar from previous surgery: Secondary | ICD-10-CM

## 2022-06-10 DIAGNOSIS — O98313 Other infections with a predominantly sexual mode of transmission complicating pregnancy, third trimester: Secondary | ICD-10-CM

## 2022-06-10 DIAGNOSIS — Z3480 Encounter for supervision of other normal pregnancy, unspecified trimester: Secondary | ICD-10-CM

## 2022-06-10 DIAGNOSIS — O34219 Maternal care for unspecified type scar from previous cesarean delivery: Secondary | ICD-10-CM

## 2022-06-10 DIAGNOSIS — Z3A27 27 weeks gestation of pregnancy: Secondary | ICD-10-CM

## 2022-06-10 DIAGNOSIS — Z3A38 38 weeks gestation of pregnancy: Secondary | ICD-10-CM

## 2022-06-10 NOTE — Progress Notes (Signed)
   PRENATAL VISIT NOTE  Subjective:  Cathy Benjamin is a 32 y.o. [phone] at 52w0dbeing seen today for ongoing prenatal care.  She is currently monitored for the following issues for this low-risk pregnancy and has Supervision of other normal pregnancy, antepartum; History of VBAC; Genital herpes; and [redacted] weeks gestation of pregnancy on their problem list.  Patient reports  lost mucous plug, some extra leaking after voiding since yesterday .  Contractions: Irregular. Vag. Bleeding: None.  Movement: Present. Denies leaking of fluid.   The following portions of the patient's history were reviewed and updated as appropriate: allergies, current medications, past family history, past medical history, past social history, past surgical history and problem list.   Objective:   Vitals:   06/10/22 1532  BP: 119/71  Pulse: (!) 101  Weight: 193 lb (87.5 kg)    Fetal Status:     Movement: Present     General:  Alert, oriented and cooperative. Patient is in no acute distress.  Skin: Skin is warm and dry. No rash noted.   Cardiovascular: Normal heart rate noted  Respiratory: Normal respiratory effort, no problems with respiration noted  Abdomen: Soft, gravid, appropriate for gestational age.  Pain/Pressure: Present     Pelvic: Cervical exam performed in the presence of a chaperone        Extremities: Normal range of motion.     Mental Status: Normal mood and affect. Normal behavior. Normal judgment and thought content.  SSE- negative for Valsalva, pool, and pH CVX- 3-4/90/-2/soft/mid FHR- 130s FH-38  Assessment and Plan:  Pregnancy: G4P2012 at 364w0d. [redacted] weeks gestation of pregnancy   2. History of VBAC - She prefers TOLAC with this pregnancy  3. Supervision of other normal pregnancy, antepartum   4. Genital herpes simplex, unspecified site - on valtrex, no sign/symptoms of outbreak  5. [redacted] weeks gestation of pregnancy   Term labor symptoms and general obstetric precautions  including but not limited to vaginal bleeding, contractions, leaking of fluid and fetal movement were reviewed in detail with the patient. Please refer to After Visit Summary for other counseling recommendations.   No follow-ups on file.  Future Appointments  Date Time Provider Department Center  06/17/2022  9:30 AM EvHarlin HeysMD [facility] None    [name]

## 2022-06-10 NOTE — Progress Notes (Signed)
ROB- cervix check

## 2022-06-11 ENCOUNTER — Encounter: Payer: 59 | Admitting: Obstetrics

## 2022-06-11 DIAGNOSIS — Z3483 Encounter for supervision of other normal pregnancy, third trimester: Secondary | ICD-10-CM

## 2022-06-17 ENCOUNTER — Encounter: Payer: Self-pay | Admitting: Obstetrics and Gynecology

## 2022-06-17 ENCOUNTER — Ambulatory Visit (INDEPENDENT_AMBULATORY_CARE_PROVIDER_SITE_OTHER): Payer: 59 | Admitting: Obstetrics and Gynecology

## 2022-06-17 VITALS — BP 127/84 | HR 97 | Wt 198.1 lb

## 2022-06-17 DIAGNOSIS — Z3480 Encounter for supervision of other normal pregnancy, unspecified trimester: Secondary | ICD-10-CM

## 2022-06-17 DIAGNOSIS — Z3483 Encounter for supervision of other normal pregnancy, third trimester: Secondary | ICD-10-CM

## 2022-06-17 DIAGNOSIS — Z98891 History of uterine scar from previous surgery: Secondary | ICD-10-CM

## 2022-06-17 DIAGNOSIS — Z3A39 39 weeks gestation of pregnancy: Secondary | ICD-10-CM

## 2022-06-17 LAB — POCT URINALYSIS DIPSTICK OB
Bilirubin, UA: NEGATIVE
Blood, UA: NEGATIVE
Glucose, UA: NEGATIVE
Ketones, UA: NEGATIVE
Nitrite, UA: NEGATIVE
POC,PROTEIN,UA: NEGATIVE
Spec Grav, UA: 1.015 (ref 1.010–1.025)
Urobilinogen, UA: 0.2 E.U./dL
pH, UA: 6 (ref 5.0–8.0)

## 2022-06-17 NOTE — Progress Notes (Signed)
ROB: She has no complaints.  Reports daily fetal movement.  No painful contractions.  Signs and symptoms of labor discussed.  Induction discussed for postdates.  Scheduled for 06/25/2022 7 AM.  Last labor was rapid.  Patient wants an epidural early.  Does not like painful Pitocin contractions without an epidural.(I have no problem with an early epidural and Pitocin and Foley bulb or AROM)

## 2022-06-17 NOTE — Progress Notes (Signed)
ROB. Patient states daily fetal movement and sharp pelvic pains. She would like a cervical check today. Patient states no questions or concerns at this time.

## 2022-06-19 ENCOUNTER — Encounter: Payer: Self-pay | Admitting: Obstetrics and Gynecology

## 2022-06-19 ENCOUNTER — Other Ambulatory Visit: Payer: Self-pay

## 2022-06-19 ENCOUNTER — Inpatient Hospital Stay
Admission: EM | Admit: 2022-06-19 | Discharge: 2022-06-21 | DRG: 806 | Disposition: A | Discharge status: Home / Self Care | Payer: 59 | Attending: Obstetrics | Admitting: Obstetrics

## 2022-06-19 ENCOUNTER — Encounter: Payer: Self-pay | Admitting: Obstetrics

## 2022-06-19 ENCOUNTER — Inpatient Hospital Stay: Payer: 59 | Admitting: Anesthesiology

## 2022-06-19 DIAGNOSIS — D62 Acute posthemorrhagic anemia: Secondary | ICD-10-CM | POA: Diagnosis not present

## 2022-06-19 DIAGNOSIS — O9832 Other infections with a predominantly sexual mode of transmission complicating childbirth: Secondary | ICD-10-CM | POA: Diagnosis present

## 2022-06-19 DIAGNOSIS — O34219 Maternal care for unspecified type scar from previous cesarean delivery: Principal | ICD-10-CM | POA: Diagnosis present

## 2022-06-19 DIAGNOSIS — O368130 Decreased fetal movements, third trimester, not applicable or unspecified: Secondary | ICD-10-CM | POA: Diagnosis present

## 2022-06-19 DIAGNOSIS — O26893 Other specified pregnancy related conditions, third trimester: Secondary | ICD-10-CM | POA: Diagnosis present

## 2022-06-19 DIAGNOSIS — O9903 Anemia complicating the puerperium: Secondary | ICD-10-CM

## 2022-06-19 DIAGNOSIS — O99214 Obesity complicating childbirth: Secondary | ICD-10-CM | POA: Diagnosis present

## 2022-06-19 DIAGNOSIS — A6000 Herpesviral infection of urogenital system, unspecified: Secondary | ICD-10-CM | POA: Diagnosis present

## 2022-06-19 DIAGNOSIS — Z3A39 39 weeks gestation of pregnancy: Secondary | ICD-10-CM

## 2022-06-19 DIAGNOSIS — O9902 Anemia complicating childbirth: Secondary | ICD-10-CM | POA: Diagnosis present

## 2022-06-19 LAB — CBC
HCT: 37.4 % (ref 36.0–46.0)
Hemoglobin: 12.1 g/dL (ref 12.0–15.0)
MCH: 27.7 pg (ref 26.0–34.0)
MCHC: 32.4 g/dL (ref 30.0–36.0)
MCV: 85.6 fL (ref 80.0–100.0)
Platelets: 203 10*3/uL (ref 150–400)
RBC: 4.37 MIL/uL (ref 3.87–5.11)
RDW: 13.2 % (ref 11.5–15.5)
WBC: 11.6 10*3/uL — ABNORMAL HIGH (ref 4.0–10.5)
nRBC: 0 % (ref 0.0–0.2)

## 2022-06-19 LAB — RPR: RPR Ser Ql: NONREACTIVE

## 2022-06-19 LAB — TYPE AND SCREEN
ABO/RH(D): A POS
Antibody Screen: NEGATIVE

## 2022-06-19 MED ORDER — DIPHENHYDRAMINE HCL 50 MG/ML IJ SOLN
50.0000 mg | Freq: Once | INTRAMUSCULAR | Status: AC
  Administered 2022-06-19: 50 mg via INTRAVENOUS
  Filled 2022-06-19: qty 1

## 2022-06-19 MED ORDER — SENNOSIDES-DOCUSATE SODIUM 8.6-50 MG PO TABS
2.0000 | ORAL_TABLET | Freq: Every day | ORAL | Status: DC
  Administered 2022-06-20 – 2022-06-21 (×2): 2 via ORAL
  Filled 2022-06-19 (×2): qty 2

## 2022-06-19 MED ORDER — OXYTOCIN BOLUS FROM INFUSION
333.0000 mL | Freq: Once | INTRAVENOUS | Status: AC
  Administered 2022-06-19: 333 mL via INTRAVENOUS

## 2022-06-19 MED ORDER — EPHEDRINE 5 MG/ML INJ: 10.0000 mg | INTRAVENOUS | Status: DC | PRN

## 2022-06-19 MED ORDER — DIBUCAINE (PERIANAL) 1 % EX OINT: 1.0000 | TOPICAL_OINTMENT | CUTANEOUS | Status: DC | PRN

## 2022-06-19 MED ORDER — ACETAMINOPHEN 325 MG PO TABS: 650.0000 mg | ORAL_TABLET | ORAL | Status: DC | PRN

## 2022-06-19 MED ORDER — LACTATED RINGERS IV SOLN: 500.0000 mL | INTRAVENOUS | Status: DC | PRN

## 2022-06-19 MED ORDER — ONDANSETRON HCL 4 MG PO TABS: 4.0000 mg | ORAL_TABLET | ORAL | Status: DC | PRN

## 2022-06-19 MED ORDER — PHENYLEPHRINE 80 MCG/ML (10ML) SYRINGE FOR IV PUSH (FOR BLOOD PRESSURE SUPPORT): 80.0000 ug | PREFILLED_SYRINGE | INTRAVENOUS | Status: DC | PRN

## 2022-06-19 MED ORDER — MISOPROSTOL 200 MCG PO TABS
ORAL_TABLET | ORAL | Status: AC
  Filled 2022-06-19: qty 4

## 2022-06-19 MED ORDER — ACETAMINOPHEN 325 MG PO TABS
650.0000 mg | ORAL_TABLET | ORAL | Status: DC | PRN
  Filled 2022-06-19 (×2): qty 2

## 2022-06-19 MED ORDER — FENTANYL-BUPIVACAINE-NACL 0.5-0.125-0.9 MG/250ML-% EP SOLN
12.0000 mL/h | EPIDURAL | Status: DC | PRN
  Administered 2022-06-19: 12 mL/h via EPIDURAL

## 2022-06-19 MED ORDER — TETANUS-DIPHTH-ACELL PERTUSSIS 5-2.5-18.5 LF-MCG/0.5 IM SUSY
0.5000 mL | PREFILLED_SYRINGE | Freq: Once | INTRAMUSCULAR | Status: DC
  Filled 2022-06-19: qty 0.5

## 2022-06-19 MED ORDER — WITCH HAZEL-GLYCERIN EX PADS
1.0000 | MEDICATED_PAD | CUTANEOUS | Status: DC | PRN
  Administered 2022-06-19: 1 via TOPICAL
  Filled 2022-06-19 (×2): qty 100

## 2022-06-19 MED ORDER — FENTANYL-BUPIVACAINE-NACL 0.5-0.125-0.9 MG/250ML-% EP SOLN
EPIDURAL | Status: AC
  Filled 2022-06-19: qty 250

## 2022-06-19 MED ORDER — COCONUT OIL OIL: 1.0000 | TOPICAL_OIL | Status: DC | PRN

## 2022-06-19 MED ORDER — LACTATED RINGERS IV SOLN: 500.0000 mL | Freq: Once | INTRAVENOUS | Status: DC

## 2022-06-19 MED ORDER — DIPHENHYDRAMINE HCL 50 MG/ML IJ SOLN: 12.5000 mg | INTRAMUSCULAR | Status: DC | PRN

## 2022-06-19 MED ORDER — LIDOCAINE HCL (PF) 1 % IJ SOLN
30.0000 mL | INTRAMUSCULAR | Status: DC | PRN
  Filled 2022-06-19: qty 30

## 2022-06-19 MED ORDER — SOD CITRATE-CITRIC ACID 500-334 MG/5ML PO SOLN: 30.0000 mL | ORAL | Status: DC | PRN

## 2022-06-19 MED ORDER — LIDOCAINE-EPINEPHRINE (PF) 1.5 %-1:200000 IJ SOLN
INTRAMUSCULAR | Status: DC | PRN
  Administered 2022-06-19: 3 mL via EPIDURAL

## 2022-06-19 MED ORDER — BENZOCAINE-MENTHOL 20-0.5 % EX AERO
1.0000 | INHALATION_SPRAY | CUTANEOUS | Status: DC | PRN
  Administered 2022-06-19: 1 via TOPICAL
  Filled 2022-06-19: qty 56

## 2022-06-19 MED ORDER — SIMETHICONE 80 MG PO CHEW: 80.0000 mg | CHEWABLE_TABLET | ORAL | Status: DC | PRN

## 2022-06-19 MED ORDER — OXYTOCIN-SODIUM CHLORIDE 30-0.9 UT/500ML-% IV SOLN
2.5000 [IU]/h | INTRAVENOUS | Status: DC
  Administered 2022-06-19: 2.5 [IU]/h via INTRAVENOUS
  Filled 2022-06-19: qty 500

## 2022-06-19 MED ORDER — ONDANSETRON HCL 4 MG/2ML IJ SOLN: 4.0000 mg | INTRAMUSCULAR | Status: DC | PRN

## 2022-06-19 MED ORDER — DIPHENHYDRAMINE HCL 25 MG PO CAPS: 25.0000 mg | ORAL_CAPSULE | Freq: Four times a day (QID) | ORAL | Status: DC | PRN

## 2022-06-19 MED ORDER — PRENATAL MULTIVITAMIN CH
1.0000 | ORAL_TABLET | Freq: Every day | ORAL | Status: DC
  Administered 2022-06-19 – 2022-06-20 (×2): 1 via ORAL
  Filled 2022-06-19 (×2): qty 1

## 2022-06-19 MED ORDER — FENTANYL CITRATE (PF) 100 MCG/2ML IJ SOLN: 50.0000 ug | INTRAMUSCULAR | Status: DC | PRN

## 2022-06-19 MED ORDER — HYDROXYZINE HCL 25 MG PO TABS: 50.0000 mg | ORAL_TABLET | Freq: Four times a day (QID) | ORAL | Status: DC | PRN

## 2022-06-19 MED ORDER — BUPIVACAINE HCL (PF) 0.25 % IJ SOLN
INTRAMUSCULAR | Status: DC | PRN
  Administered 2022-06-19: 8 mL via EPIDURAL

## 2022-06-19 MED ORDER — IBUPROFEN 600 MG PO TABS
600.0000 mg | ORAL_TABLET | Freq: Four times a day (QID) | ORAL | Status: DC
  Administered 2022-06-20: 600 mg via ORAL
  Filled 2022-06-19 (×3): qty 1

## 2022-06-19 MED ORDER — LACTATED RINGERS IV SOLN: INTRAVENOUS | Status: DC

## 2022-06-19 MED ORDER — OXYTOCIN 10 UNIT/ML IJ SOLN
INTRAMUSCULAR | Status: AC
  Filled 2022-06-19: qty 2

## 2022-06-19 MED ORDER — ONDANSETRON HCL 4 MG/2ML IJ SOLN: 4.0000 mg | Freq: Four times a day (QID) | INTRAMUSCULAR | Status: DC | PRN

## 2022-06-19 NOTE — Anesthesia Procedure Notes (Signed)
Epidural Patient location during procedure: OB Start time: 06/19/2022 2:53 AM End time: 06/19/2022 3:02 AM  Staffing Anesthesiologist: Darrin Nipper, MD Performed: anesthesiologist   Preanesthetic Checklist Completed: patient identified, IV checked, risks and benefits discussed, surgical consent, monitors and equipment checked, pre-op evaluation and timeout performed  Epidural Patient position: sitting Prep: Betadine Patient monitoring: heart rate, continuous pulse ox and blood pressure Approach: midline Location: L2-L3 Injection technique: LOR air  Needle:  Needle type: Tuohy  Needle gauge: 17 G Needle length: 9 cm Needle insertion depth: 5.5 cm Catheter at skin depth: 10.5 cm Test dose: negative and 1.5% lidocaine with Epi 1:200 K  Assessment Sensory level: T4  Additional Notes Straightforward placement without apparent complications. Reason for block:procedure for pain

## 2022-06-19 NOTE — Progress Notes (Addendum)
LABOR NOTE   SUBJECTIVE:   Cathy Benjamin is a 32 y.o.  [id number]  at 45w2dwho presented in spontaneous labor. She is comfortable with her epidural. Contractions have spaced out some. Offered AROM. WJaneeneis in agreement.  Analgesia: Epidural  OBJECTIVE:  BP 118/74   Pulse 88   Temp 98.1 F (36.7 C) (Oral)   Resp 18   Ht '5\' 1"'$  (1.549 m)   Wt 87.5 kg   LMP 08/11/2021   SpO2 97%   BMI 36.47 kg/m  No intake/output data recorded.  SVE:   Dilation: 5 Effacement (%): 90 Station: -2 Exam by:: MCherylann Parr CNM CONTRACTIONS: regular, every 3-5 minutes FHR: Fetal heart tracing reviewed. Baseline: 125 Variability: moderate Accelerations: present Decelerations:isolated variable decels Category 1/2  Labs: Lab Results  Component Value Date   WBC 11.6 (H) 06/19/2022   HGB 12.1 06/19/2022   HCT 37.4 06/19/2022   MCV 85.6 06/19/2022   PLT 203 06/19/2022    ASSESSMENT: 1) Spontaneous labor, progressing normally     Coping: well     Membranes: ruptured, clear fluid  Principal Problem:   Labor and delivery, indication for care   PLAN: AROM Anticipate NSVD   MLloyd Huger CNM 06/19/2022 5:22 AM

## 2022-06-19 NOTE — Lactation Note (Signed)
This note was copied from a baby's chart. Lactation Consultation Note  Patient Name: Cathy Benjamin RWERX'V Date: 06/19/2022 Reason for consult: L&D Initial assessment;Difficult latch;RN request;Breastfeeding assistance;Term Age:30 hours  Maternal Data This is mom's 3rd baby, SVD. 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 Per mom she would like for baby to receive colostrum and her plan is to offer the baby to breastfeed and bottle feed the baby formula.This is what she did with her 2 previous children and plans to do the same. Today at initial consult assisted mom with breastfeeding in L&D post delivery. Transitional care nurse requested [place] assistance as baby was not maintaining latch and rolling bottom lip inward. Has patient been taught Hand Expression?: Yes Does the patient have breastfeeding experience prior to this delivery?: Yes How long did the patient breastfeed?: brief time for her two previous babies to get some colostrum  Feeding Mother's Current Feeding Choice: Breast Milk and Formula Provided assistance with breastfeeding. Mother sleepy after receiving benadryl. Baby was able to achieve latch with audible swallows noted. LC assisted mom with positioning and supporting baby at the right breast. Of note, Care Nurse checked baby's glucose which was within normal limits.  LATCH Score Latch: Repeated attempts needed to sustain latch, nipple held in mouth throughout feeding, stimulation needed to elicit sucking reflex.  Audible Swallowing: Spontaneous and intermittent  Type of Nipple: Everted at rest and after stimulation  Comfort (Breast/Nipple): Soft / non-tender  Hold (Positioning): Assistance needed to correctly position infant at breast and maintain latch.  LATCH Score: 8    Interventions Interventions: Breast feeding basics reviewed;Assisted with latch;Skin to skin;Breast massage;Hand express;Reverse pressure;Breast compression;Adjust position;Support  pillows;Position options;Expressed milk   Consult Status Consult Status: Follow-up Date: 06/20/22 Follow-up type: In-patient  Update provided to care nurse.Care nurse in attendance for part of the feeding.  Jonna Karsyn Jamie 06/19/2022, 2:44 PM

## 2022-06-19 NOTE — Progress Notes (Signed)
LABOR NOTE   SUBJECTIVE:   Cathy Benjamin is a 32 y.o.  [id number]  at 52w2dwho is in spontaneous labor. She is progressing well. She has been intermittently feeling some rectal pressure. She is resting comfortably with her epidural.   Analgesia: Epidural  OBJECTIVE:  BP (!) 126/49 (BP Location: Right Arm)   Pulse 83   Temp 98.6 F (37 C) (Oral)   Resp 16   Ht '5\' 1"'$  (1.549 m)   Wt 87.5 kg   LMP 08/11/2021   SpO2 97%   BMI 36.47 kg/m  No intake/output data recorded.  SVE:   Dilation: 7 Effacement (%): 90 Station: 0 Exam by:: MCherylann ParrCNM CONTRACTIONS: regular, every 1-3 minutes FHR: Fetal heart tracing reviewed. Baseline: 115 Variability: moderate Accelerations: present Decelerations:variable Category 2  Labs: Lab Results  Component Value Date   WBC 11.6 (H) 06/19/2022   HGB 12.1 06/19/2022   HCT 37.4 06/19/2022   MCV 85.6 06/19/2022   PLT 203 06/19/2022    ASSESSMENT: 1) Spontaneous labor, progressing normally     Coping: well     Membranes: ruptured, clear fluid       Principal Problem:   Labor and delivery, indication for care   PLAN: Expectant management Anticipate NSVD MD updated  MLloyd Huger CNM 06/19/2022 8:10 AM

## 2022-06-19 NOTE — H&P (Signed)
History and Physical   HPI  Cathy Benjamin is a 32 y.o. [id number] at 8w2dEstimated Date of Delivery: 06/24/22 who is being admitted for labor management. Contractions started last night around 2100 and became more regular and painful around 2300. Her cervix changed from 3 to 4 cm over an hour in triage. She has a h/o cesarean birth and a successful VBAC. She started on HSV suppression at 36 weeks and has no s/s of an outbreak. She denies LOF and vaginal bleeding. She reports decreased fetal movement since coming to L&D.   OB History  OB History  Gravida Para Term Preterm AB Living  '4 2 2 '$ 0 1 2  SAB IAB Ectopic Multiple Live Births  1 0 0 0 2    # Outcome Date GA Lbr Len/2nd Weight Sex Delivery Anes PTL Lv  4 Current           3 Term 07/05/16     VBAC   LIV  2 Term 11/17/14     CS-LTranv   LIV  1 SAB 09/06/09            PROBLEM LIST  Pregnancy complications or risks: Patient Active Problem List   Diagnosis Date Noted   Labor and delivery, indication for care 06/19/2022   [redacted] weeks gestation of pregnancy 03/30/2022   History of VBAC 12/17/2021   Supervision of other normal pregnancy, antepartum 10/22/2021   Genital herpes 02/2020    Prenatal labs and studies: ABO, Rh: A/Positive/-- (06/14 1122) Antibody: Negative (06/14 1122) Rubella: 1.33 (06/14 1122) RPR: Non Reactive (11/14 1021)  HBsAg: Negative (06/14 1122)  HIV: Non Reactive (11/14 1021)  GRJJ:OACZYSAY/- (01/03 1458)   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    valACYclovir (VALTREX) 1000 MG tablet Take 1 tablet (1,000 mg total) by mouth 2 (two) times daily. Qty: 30 tablet, Refills: 1         Allergies  Patient has no known allergies.  Review of Systems  Pertinent items noted in HPI and remainder of comprehensive ROS otherwise negative.  Physical Exam  BP 137/85 (BP Location: Right Arm)   Pulse 94   Temp 98.1 F (36.7 C) (Oral)   Resp 18   Ht '5\' 1"'$  (1.549 m)   Wt 87.5 kg   LMP 08/11/2021   BMI 36.47 kg/m   Lungs:  CTAB Cardio: RRR without M/R/G Abd: Soft, gravid, NT Presentation: cephalic  CERVIX: Dilation: 4 Effacement (%): 90 Station: -2 Presentation: Vertex Exam by:: MCherylann ParrCNM  See Prenatal records for more detailed PE.     FHR:  Baseline: 140 Variability: moderate Accelerations:present Decelerations: early Toco: regular, every 2-4 minutes Category 1  Test Results  No results found for this or any previous visit (from the past 24 hour(s)). Group  B Strep negative  Assessment   [id number] at 56w2dEstimated Date of Delivery: 06/24/22  Reassuring maternal/fetal status.  Patient Active Problem List   Diagnosis Date Noted   Labor and delivery, indication for care 06/19/2022   [redacted] weeks gestation of pregnancy 03/30/2022   History of VBAC 12/17/2021   Supervision of other normal pregnancy, antepartum 10/22/2021   Genital herpes 02/2020    Plan  1. Admit to L&D   2. EFM:  Category 1 3. Pharmacologic pain relief if desired.   4. Admission labs  5. Anticipate NSVD 6. Dr. CMarcelline Matesnotified of admission  MLloyd Huger [facility]1/26/2024 2:08 AM

## 2022-06-19 NOTE — Discharge Summary (Signed)
OB Discharge Summary     Patient Name: Cathy Benjamin DOB: 06/24/1990 MRN: [id number]  Date of admission: 06/19/2022 Delivering provider: Rod Can, CNM  Date of Delivery: 06/19/2022  Date of discharge: 06/21/22  Admitting diagnosis: Labor and delivery, indication for care [O75.9] Intrauterine pregnancy: [redacted]w[redacted]d    Secondary diagnosis: None     Discharge diagnosis: Term Pregnancy Delivered                                                                                                Post partum procedures: none  Augmentation: N/A  Complications: None  Hospital course:  Onset of Labor With Vaginal Delivery      32y.o. yo [id number] 30w2das admitted in Active Labor on 06/19/2022. Labor course was complicated by: NA  Membrane Rupture Time/Date: 2:29 AM ,06/19/2022   Delivery Method:Vaginal, Spontaneous  Episiotomy: None  Lacerations:  None  See delivery note for details  Patient had a postpartum course with complications.  She is tolerating a regular diet, her pain is controlled with PO medications, she is ambulating and voiding without difficulty. Has had a BM. Plans to give baby colostrum but only short term. Baby has been somewhat gaggy and they have offered formula, they are comfortable with bottles. Cathy Benjamin would like to pump colostrum and give it via bottle but again likely for less than a week.  Patient is discharged home in stable condition on 06/21/22.  Newborn Data: Birth date:06/19/2022  Birth time:11:05 AM  Gender:Female     [facility] Living status:Living  Apgars:6 ,9  Weight:4120 g  9 pounds 1 ounce  Physical exam  Vitals:   06/20/22 0809 06/20/22 1601 06/20/22 2354 06/21/22 0818  BP: 108/79 118/72 128/87 117/79  Pulse: 73 90 74 71  Resp: '18 18 20 19  '$ Temp: 97.7 F (36.5 C) 97.8 F (36.6 C) 98 F (36.7 C) 98.4 F (36.9 C)  TempSrc: Oral Oral Oral Oral  SpO2: 98% 99% 96% 97%  Weight:      Height:       General: alert, cooperative, and no  distress Lochia: appropriate Uterine Fundus: firm Incision: N/A DVT Evaluation: No evidence of DVT seen on physical exam. Negative Homan's sign. No cords or calf tenderness. No significant calf/ankle edema.  Labs: Lab Results  Component Value Date   WBC 14.5 (H) 06/20/2022   HGB 11.4 (L) 06/20/2022   HCT 35.5 (L) 06/20/2022   MCV 85.7 06/20/2022   PLT 193 06/20/2022    Discharge instruction: per After Visit Summary.  Medications:  Allergies as of 06/21/2022   No Known Allergies      Medication List     STOP taking these medications    valACYclovir 1000 MG tablet Commonly known as: Valtrex       TAKE these medications    PRENATAL VITAMIN PO Take by mouth.        Diet: routine diet  Activity: Advance as tolerated. Pelvic rest for 6 weeks.   Outpatient follow up:  Follow-up Information     GlRod CanCNM. Schedule an appointment as soon as possible for  a visit in 2 week(s).   Specialty: Obstetrics Why: 2 weeks MD visit to plan tubal and 6 weeks postpartum visit Contact information: [address] [place] [address] [phone]                   Postpartum contraception:  interval Tubal Ligation  Rhogam Given postpartum: Rh positive Rubella vaccine given postpartum: immune Varicella vaccine given postpartum: immune TDaP given antepartum or postpartum: declined   Postpartum edu Counseled on normal uterine involution and vaginal bleeding postpartum Discussed danger signs including s/sx of infection/excessive bleeding or uncontrolled pain Encouraged to rest when possible and stay well hydrated Encouraged to continue to breastfeed, discussed latching, position changes, cluster feeding, hunger cues, lactogensis II, milk supply, sore nipple management Addressed laceration healing and perineal cleaning with peri bottle Counseled on postpartum anxiety and depression s/sx, when to contact clinic/seek mental health support Pt  understands return to fertility, reviewed BCM use/effectiveness/benefits/side effects/risks Advised when to resume increased physical activity, using tampons, intercourse Recommend taking ibuprofen and/or tylenol for pain     Newborn Delivery   Birth date/time: 06/19/2022 11:05:00 Delivery type: Vaginal, Spontaneous       Baby Feeding: Bottle and Breast  Disposition:home with mother  SIGNED:  Maggie Font CNM 06/21/22 1:18 PM

## 2022-06-19 NOTE — Anesthesia Preprocedure Evaluation (Addendum)
Anesthesia Evaluation  Patient identified by MRN, date of birth, ID band Patient awake    Reviewed: Allergy & Precautions, NPO status , Patient's Chart, lab work & pertinent test results  History of Anesthesia Complications Negative for: history of anesthetic complications  Airway Mallampati: III   Neck ROM: Full    Dental   Pulmonary former smoker   Pulmonary exam normal breath sounds clear to auscultation       Cardiovascular Exercise Tolerance: Good negative cardio ROS Normal cardiovascular exam Rhythm:Regular Rate:Normal     Neuro/Psych negative neurological ROS     GI/Hepatic negative GI ROS,,,  Endo/Other  Obesity   Renal/GU negative Renal ROS     Musculoskeletal   Abdominal   Peds  Hematology negative hematology ROS (+)   Anesthesia Other Findings   Reproductive/Obstetrics Prior c-section                             Anesthesia Physical Anesthesia Plan  ASA: 2  Anesthesia Plan: Epidural   Post-op Pain Management:    Induction:   PONV Risk Score and Plan: 2 and Treatment may vary due to age or medical condition  Airway Management Planned: Natural Airway  Additional Equipment:   Intra-op Plan:   Post-operative Plan:   Informed Consent: I have reviewed the patients History and Physical, chart, labs and discussed the procedure including the risks, benefits and alternatives for the proposed anesthesia with the patient or authorized representative who has indicated his/her understanding and acceptance.     Dental Advisory Given  Plan Discussed with:   Anesthesia Plan Comments: (Patient reports no bleeding problems and no anticoagulant use.   Patient consented for risks of anesthesia including but not limited to:  - adverse reactions to medications - risk of bleeding, infection and or nerve damage from epidural that could lead to paralysis - risk of headache or  failed epidural - nerve damage due to positioning - that if epidural is used for C-section that there is a chance of epidural failure requiring spinal placement or conversion to GA - Damage to heart, brain, lungs, other parts of body or loss of life  Patient voiced understanding.)       Anesthesia Quick Evaluation

## 2022-06-20 LAB — CBC
HCT: 35.5 % — ABNORMAL LOW (ref 36.0–46.0)
Hemoglobin: 11.4 g/dL — ABNORMAL LOW (ref 12.0–15.0)
MCH: 27.5 pg (ref 26.0–34.0)
MCHC: 32.1 g/dL (ref 30.0–36.0)
MCV: 85.7 fL (ref 80.0–100.0)
Platelets: 193 10*3/uL (ref 150–400)
RBC: 4.14 MIL/uL (ref 3.87–5.11)
RDW: 13.4 % (ref 11.5–15.5)
WBC: 14.5 10*3/uL — ABNORMAL HIGH (ref 4.0–10.5)
nRBC: 0 % (ref 0.0–0.2)

## 2022-06-20 MED ORDER — IBUPROFEN 600 MG PO TABS
600.0000 mg | ORAL_TABLET | Freq: Four times a day (QID) | ORAL | Status: DC
  Administered 2022-06-20 – 2022-06-21 (×5): 600 mg via ORAL
  Filled 2022-06-20 (×5): qty 1

## 2022-06-20 NOTE — Progress Notes (Signed)
Obstetric Postpartum Daily Progress Note Subjective:  32 y.o. [id number] postpartum day #1 status post vaginal delivery.  She is ambulating, is tolerating po, is voiding spontaneously.  Her pain is well controlled on PO pain medications. Her lochia is less than menses. Attempting to breastfeeding, infant latching but not sustaining a suck.  He seems to gag at the breast and with a bottle. They will remain in the hospital for another to sort out feeding concerns.    Medications SCHEDULED MEDICATIONS   ibuprofen  600 mg Oral Q6H   prenatal multivitamin  1 tablet Oral Q1200   senna-docusate  2 tablet Oral Daily   Tdap  0.5 mL Intramuscular Once    MEDICATION INFUSIONS    PRN MEDICATIONS  acetaminophen, benzocaine-Menthol, coconut oil, witch hazel-glycerin **AND** dibucaine, diphenhydrAMINE, ondansetron **OR** ondansetron (ZOFRAN) IV, simethicone    Objective:   Vitals:   06/19/22 1956 06/19/22 2340 06/20/22 0335 06/20/22 0809  BP: 130/87 103/76 118/79 108/79  Pulse: 99 91 82 73  Resp: '18 18 20 18  '$ Temp: 98 F (36.7 C) 98.6 F (37 C) 98.2 F (36.8 C) 97.7 F (36.5 C)  TempSrc: Oral Oral Oral Oral  SpO2: 96% 99% 98% 98%  Weight:      Height:        Current Vital Signs 24h Vital Sign Ranges  T 97.7 F (36.5 C) Temp  Avg: 98.2 F (36.8 C)  Min: 97.7 F (36.5 C)  Max: 98.6 F (37 C)  BP 108/79 BP  Min: 103/76  Max: 130/87  HR 73 Pulse  Avg: 89.3  Min: 73  Max: 99  RR 18 Resp  Avg: 18.5  Min: 18  Max: 20  SaO2 98 % Room Air SpO2  Avg: 97.8 %  Min: 96 %  Max: 99 %       24 Hour I/O Current Shift I/O  Time Ins Outs 01/26 0701 - 01/27 0700 In: -  Out: 1175 [Urine:650] No intake/output data recorded.  General: NAD Pulmonary: no increased work of breathing Abdomen: non-distended, non-tender, fundus firm at level of umbilicus Extremities: trace edema, no erythema, no tenderness  Labs:  Recent Labs  Lab 06/19/22 0213 06/20/22 0622  WBC 11.6* 14.5*  HGB 12.1 11.4*  HCT 37.4  35.5*  PLT 203 193     Assessment:   32 y.o. [id number] postpartum day # 1 status post SVB  Plan:   1) Acute blood loss anemia - hemodynamically stable and asymptomatic - po ferrous sulfate  2) A POS / Rubella 1.33 (06/14 1122)/ Varicella Immune  3) TDAP status ordered for discharge   4) breast and bottle /Contraception = bilateral tubal ligation  5) Disposition discharge 06/21/2022   Jillene Bucks [facility], CNM  06/20/2022 12:09 PM

## 2022-06-20 NOTE — Lactation Note (Signed)
This note was copied from a baby's chart. Lactation Consultation Note  Patient Name: Cathy Benjamin Date: 06/20/2022 Reason for consult: Follow-up assessment;Term Age:30 hours  Maternal Data Does the patient have breastfeeding experience prior to this delivery?: Yes How long did the patient breastfeed?: first few days for colostrum  Feeding Mother's Current Feeding Choice: Breast Milk and Formula Nipple Type: Dr. Clement Husbands I have visited  mom and baby twice today, baby has been gaggy and spitty, was deep suctioned by SCN nurse around 1200 pm today, mom had breastfed before this, she states, x approx 5 min, has not breastfed since, baby has been formula feeding at last few feedings, dad reports that baby has been feeding better this pm, still uncoordinated at times with feeds, mom plans on only breastfeeding first colostrum and is mostly formula feeding this afternoon unitl baby does better at taking the bottle LATCH Score Latch:  (I did not observe a feeding)                  Lactation Tools Discussed/Used    Interventions  LC name updated on white board this am , for parents to call for questions or assistance  Discharge    Consult Status Consult Status: PRN Date: 06/21/22 Follow-up type: In-patient    Ferol Luz 06/20/2022, 5:29 PM

## 2022-06-20 NOTE — Clinical Social Work Maternal (Signed)
CLINICAL SOCIAL WORK MATERNAL/CHILD NOTE  Patient Details  Name: Cathy Benjamin MRN: [id number] Date of Birth: 01-24-1991  Date:  06/20/2022  Clinical Social Worker Initiating Note:  Raina Mina Date/Time: Initiated:  06/20/22/1109     Child's Name:  Cathy Benjamin   Biological Parents:  Father, Mother   Need for Interpreter:  None   Reason for Referral:   Cathy Benjamin Scale)   Address:  [place] [address]    Phone number:  [phone] (home)     Additional phone number:   Household Members/Support Persons (HM/SP):   Household Member/Support Person 1, Household Member/Support Person 2   HM/SP Name Relationship DOB or Age  HM/SP -1 Bess Kinds Husband    HM/SP -2 Daughter, 80 years old      HM/SP -30 Daughter 66 years old      HM/SP -4        HM/SP -5        HM/SP -6        HM/SP -7        HM/SP -8          Natural Supports (not living in the home):  Immediate Family   Professional Supports: None   Employment: Animator   Type of Work: Engineer, 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  CSW met with MOB at bedside. Explained CSW's role and reason for referral.  MOB reported she is feeling good post delivery. MOB was alert, appropriate, and attentive to Baby during assessment. She was  tearful at times.  Confirmed contact information for MOB. MOB and Baby will be living with FOB (Cathy Ovid Curd Sr.) and son Cathy Benjamin., age 30) at discharge.  MOB reported she receives [facility] and already informed her Workers of [employer] birth.  MOB plans to use [facility] for [facility].  MOB reported she has a crib, bassinet, car seat (new), clothing, diapers, and all other items needed for Baby.  MOB reported she has reliable transportation for herself and Baby.  MOB denied resource needs at this time.  MOB reported she has a history of depression. She takes Seroquel which she reported works "sometimes." She reported she has not considered switching to a different medication or talked to her Provider about this. MOB sees a Social worker at [facility] every 3 months and reported she plans to increase her visits. Her OB also plans to monitor closely for PPD. MOB was tearful during assessment but denied any specific stressors at this time. Per RN, she has placed a psych consult to talk about medications or other options. MOB reported overall she has a good support system and is coping well  emotionally at this time. MOB denied SI, HI, or DV. MOB denied the need for mental health support resources at this time, reported she is aware of resources if needed.  CSW provided education and information sheets on PPD and SIDS. MOB verbalized understanding. CSW encouraged MOB to reach out to her Provider with any questions or needs for support or resources, even after discharge.  MOB denied any needs or questions at this time. CSW encouraged MOB to reach out if any arise prior to discharge.   CSW Plan/Description:  No Further Intervention Required/No Barriers to Discharge    Raina Mina, [place] 06/20/2022, 11:44 AM

## 2022-06-21 NOTE — Anesthesia Postprocedure Evaluation (Signed)
Anesthesia Post Note  Patient: Ardyth Kelso  Procedure(s) Performed: AN AD HOC LABOR EPIDURAL  Patient location during evaluation: Mother Baby Anesthesia Type: Epidural Level of consciousness: awake and alert Pain management: pain level controlled Vital Signs Assessment: post-procedure vital signs reviewed and stable Respiratory status: spontaneous breathing, nonlabored ventilation and respiratory function stable Cardiovascular status: stable Postop Assessment: no headache, no backache, patient able to bend at knees and able to ambulate Anesthetic complications: no   No notable events documented.   Last Vitals:  Vitals:   06/20/22 2354 06/21/22 0818  BP: 128/87 117/79  Pulse: 74 71  Resp: 20 19  Temp: 36.7 C 36.9 C  SpO2: 96% 97%    Last Pain:  Vitals:   06/21/22 0900  TempSrc:   PainSc: 1                  Precious Haws Allyiah Gartner

## 2022-06-21 NOTE — Lactation Note (Addendum)
This note was copied from a baby's chart. Lactation Consultation Note  Patient Name: Cathy Benjamin [id number] Date: 06/21/2022 Reason for consult: Follow-up assessment;Term Age:30 hours  Maternal Data This is mom's 3rd baby, SVD. 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 Per mom her initial plan was for baby to receive colostrum and to offer the baby to breastfeed and then provide the baby formula after breastfeeding.  On follow-up today Mom reports she is bottle feeding baby formula and when she goes home she will pump and also provide breastmilk. She does not want to initiate pumping at this time as she is going home soon. Also, mom has the wearable breastpump so she doesn't have to sit with the pump plugged in which she feels will be helpful. Has patient been taught Hand Expression?: Yes Does the patient have breastfeeding experience prior to this delivery?: Yes How long did the patient breastfeed?: brief time for her two previous babies to get some colostrum  Feeding Mother's Current Feeding Choice: Breast Milk and Formula Mom's current plan is to formula feed baby and to begin pumping once she goes home. She will provide any expressed breastmilk to her baby in addition to the formula. Interventions Interventions: Education. Reviewed how the body knows to make milk and the importance of consistent pumping to maximize milk production (8 times/24 hours) Reviewed breast milk storage guidelines.  Discharge Discharge Education: Engorgement and breast care;Warning signs for feeding baby;Outpatient recommendation Pump: Personal  Consult Status Consult Status: Complete Date: 06/21/22 Follow-up type: In-patient  Update provided to care nurse.  Jonna Tereza Gilham 06/21/2022, 2:14 PM

## 2022-06-21 NOTE — Progress Notes (Signed)
Discharge instructions reviewed.  Printed copies given to patient for reference after discharge home.  Questions answered and follow up appointment reviewed.

## 2022-06-21 NOTE — Discharge Instructions (Signed)
Please call your office on Monday to schedule a follow up appointment at 2 weeks to discuss scheduling tubal and 6 weeks postpartum visit.

## 2022-07-02 ENCOUNTER — Telehealth: Payer: Self-pay

## 2022-07-02 NOTE — Telephone Encounter (Signed)
Called Cathy Benjamin and left voicemail to let her know I was working on [employer] again today, saw where she hasn't read her mychart message from 06/26/22, letting her know that her FMLA is done but a section needs to be completed by her before I can fax.

## 2022-08-24 ENCOUNTER — Telehealth: Payer: Self-pay

## 2022-08-24 NOTE — Telephone Encounter (Signed)
[facility]- Discharge Call Backs-Let pt a VM about the following below. 1-Do you have any questions or concerns about yourself as you heal? 2-Any concerns or questions about your baby? 3--How was your stay at the hospital? 4-How did our team work together to care for you? You should be receiving a survey in the mail soon.   We would really appreciate it if you could fill that out for [place] and return it in the mail.  We value the feedback to make improvements and continue the great work we do.   If you have any questions please feel free to call me back at [phone]
